# Patient Record
Sex: Male | Born: 1955 | Race: White | Hispanic: No | Smoking: Current every day smoker
Health system: Southern US, Community
[De-identification: ages and names within clinical notes are randomized; demographics above are authoritative.]

## PROBLEM LIST (undated history)

## (undated) DIAGNOSIS — D509 Iron deficiency anemia, unspecified: Principal | ICD-10-CM

## (undated) DIAGNOSIS — C189 Malignant neoplasm of colon, unspecified: Principal | ICD-10-CM

## (undated) DIAGNOSIS — R634 Abnormal weight loss: Secondary | ICD-10-CM

## (undated) DIAGNOSIS — D649 Anemia, unspecified: Secondary | ICD-10-CM

## (undated) DIAGNOSIS — R5383 Other fatigue: Secondary | ICD-10-CM

## (undated) DIAGNOSIS — M940 Chondrocostal junction syndrome [Tietze]: Secondary | ICD-10-CM

## (undated) DIAGNOSIS — K219 Gastro-esophageal reflux disease without esophagitis: Secondary | ICD-10-CM

## (undated) DIAGNOSIS — C787 Secondary malignant neoplasm of liver and intrahepatic bile duct: Principal | ICD-10-CM

## (undated) HISTORY — DX: Other fatigue: R53.83

## (undated) HISTORY — DX: Secondary malignant neoplasm of liver and intrahepatic bile duct: C78.7

## (undated) HISTORY — DX: Chondrocostal junction syndrome (tietze): M94.0

## (undated) HISTORY — DX: Malignant neoplasm of colon, unspecified: C18.9

## (undated) HISTORY — DX: Iron deficiency anemia, unspecified: D50.9

## (undated) HISTORY — PX: EXPLORATORY LAPAROTOMY: SUR591

## (undated) HISTORY — DX: Abnormal weight loss: R63.4

## (undated) HISTORY — DX: Anemia, unspecified: D64.9

## (undated) HISTORY — DX: Gastro-esophageal reflux disease without esophagitis: K21.9

---

## 2000-05-14 ENCOUNTER — Emergency Department (HOSPITAL_COMMUNITY): Admission: EM | Admit: 2000-05-14 | Discharge: 2000-05-14 | Payer: Self-pay | Admitting: Emergency Medicine

## 2000-05-14 ENCOUNTER — Encounter: Payer: Self-pay | Admitting: Emergency Medicine

## 2005-08-17 ENCOUNTER — Emergency Department (HOSPITAL_COMMUNITY): Admission: EM | Admit: 2005-08-17 | Discharge: 2005-08-17 | Payer: Self-pay | Admitting: Emergency Medicine

## 2013-03-04 ENCOUNTER — Encounter: Payer: Self-pay | Admitting: Gastroenterology

## 2013-03-12 ENCOUNTER — Encounter: Payer: Self-pay | Admitting: *Deleted

## 2013-03-14 ENCOUNTER — Other Ambulatory Visit (INDEPENDENT_AMBULATORY_CARE_PROVIDER_SITE_OTHER): Payer: 59

## 2013-03-14 ENCOUNTER — Ambulatory Visit (INDEPENDENT_AMBULATORY_CARE_PROVIDER_SITE_OTHER): Payer: 59 | Admitting: Gastroenterology

## 2013-03-14 ENCOUNTER — Encounter: Payer: Self-pay | Admitting: Gastroenterology

## 2013-03-14 VITALS — BP 100/60 | HR 100 | Ht 70.0 in | Wt 179.2 lb

## 2013-03-14 DIAGNOSIS — Z8619 Personal history of other infectious and parasitic diseases: Secondary | ICD-10-CM

## 2013-03-14 DIAGNOSIS — R5381 Other malaise: Secondary | ICD-10-CM

## 2013-03-14 DIAGNOSIS — K921 Melena: Secondary | ICD-10-CM

## 2013-03-14 DIAGNOSIS — R634 Abnormal weight loss: Secondary | ICD-10-CM

## 2013-03-14 DIAGNOSIS — D649 Anemia, unspecified: Secondary | ICD-10-CM

## 2013-03-14 DIAGNOSIS — R5383 Other fatigue: Secondary | ICD-10-CM

## 2013-03-14 DIAGNOSIS — F1010 Alcohol abuse, uncomplicated: Secondary | ICD-10-CM

## 2013-03-14 LAB — HEPATITIS B SURFACE ANTIGEN: Hepatitis B Surface Ag: NEGATIVE

## 2013-03-14 LAB — HEPATIC FUNCTION PANEL
ALT: 26 U/L (ref 0–53)
Alkaline Phosphatase: 82 U/L (ref 39–117)
Bilirubin, Direct: 0.1 mg/dL (ref 0.0–0.3)
Total Bilirubin: 0.5 mg/dL (ref 0.3–1.2)
Total Protein: 8.1 g/dL (ref 6.0–8.3)

## 2013-03-14 LAB — CBC WITH DIFFERENTIAL/PLATELET
Basophils Relative: 0.2 % (ref 0.0–3.0)
Eosinophils Absolute: 0.5 10*3/uL (ref 0.0–0.7)
Eosinophils Relative: 4.1 % (ref 0.0–5.0)
Hemoglobin: 9.3 g/dL — ABNORMAL LOW (ref 13.0–17.0)
Lymphocytes Relative: 17.8 % (ref 12.0–46.0)
Monocytes Relative: 11.9 % (ref 3.0–12.0)
Neutro Abs: 8.1 10*3/uL — ABNORMAL HIGH (ref 1.4–7.7)
Neutrophils Relative %: 66 % (ref 43.0–77.0)
RBC: 4.37 Mil/uL (ref 4.22–5.81)
WBC: 12.2 10*3/uL — ABNORMAL HIGH (ref 4.5–10.5)

## 2013-03-14 LAB — BASIC METABOLIC PANEL
Calcium: 9.1 mg/dL (ref 8.4–10.5)
Creatinine, Ser: 0.7 mg/dL (ref 0.4–1.5)
Sodium: 139 mEq/L (ref 135–145)

## 2013-03-14 LAB — PROTIME-INR
INR: 1.4 ratio — ABNORMAL HIGH (ref 0.8–1.0)
Prothrombin Time: 14.3 s — ABNORMAL HIGH (ref 10.2–12.4)

## 2013-03-14 LAB — IBC PANEL: Transferrin: 246.8 mg/dL (ref 212.0–360.0)

## 2013-03-14 LAB — VITAMIN B12: Vitamin B-12: 298 pg/mL (ref 211–911)

## 2013-03-14 LAB — HEPATITIS C ANTIBODY: HCV Ab: REACTIVE — AB
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
## 2013-03-14 NOTE — Patient Instructions (Signed)
  It has been recommended to you by your physician that you have a(n) Endoscopy/Colonoscopy completed. Per your request, we did not schedule the procedure(s) today. Please contact our office at [phone] should you decide to have the procedure completed.  Your physician has requested that you go to the basement for lab work before leaving today. _________________________________________________________________                                               We are excited to introduce MyChart, a new best-in-class service that provides you online access to important information in your electronic medical record. We want to make it easier for you to view your health information - all in one secure location - when and where you need it. We expect MyChart will enhance the quality of care and service we provide.  When you register for MyChart, you can:    View your test results.    Request appointments and receive appointment reminders via email.    Request medication renewals.    View your medical history, allergies, medications and immunizations.    Communicate with your physician's office through a password-protected site.    Conveniently print information such as your medication lists.  To find out if MyChart is right for you, please talk to a member of our clinical staff today. We will gladly answer your questions about this free health and wellness tool.  If you are age 56 or older and want a member of your family to have access to your record, you must provide written consent by completing a proxy form available at our office. Please speak to our clinical staff about guidelines regarding accounts for patients younger than age 69.  As you activate your MyChart account and need any technical assistance, please call the MyChart technical support line at [phone]-CHART [phone]) or email your question to [email or web address]. If you email your question(s), please include your  name, a return phone number and the best time to reach you.  If you have non-urgent health-related questions, you can send a message to our office through MyChart at [email or web address]. If you have a medical emergency, call 911.  Thank you for using MyChart as your new health and wellness resource!   MyChart licensed from [employer],  4782-9562. Patents Pending.

## 2013-03-17 LAB — CELIAC PANEL 10
Endomysial Screen: NEGATIVE
Gliadin IgA: 5 U/mL (ref <20)
Tissue Transglut Ab: 4.4 U/mL (ref <20)
Tissue Transglutaminase Ab, IgA: 3.9 U/mL (ref <20)

## 2013-03-19 ENCOUNTER — Telehealth: Payer: Self-pay | Admitting: *Deleted

## 2013-03-19 DIAGNOSIS — Z8619 Personal history of other infectious and parasitic diseases: Secondary | ICD-10-CM

## 2013-03-19 NOTE — Telephone Encounter (Signed)
Message copied by Florene Glen on Wed Mar 19, 2013  8:28 AM ------      Message from: Jarold Motto, DAVID R      Created: Mon Mar 17, 2013  8:17 AM       Need referral to hepatitis C clinic ------

## 2013-03-19 NOTE — Telephone Encounter (Signed)
Informed pt of the need for a referral to a Hepatitis Clinic; he decided on [place] site. Faxed info to [phone].

## 2013-03-19 NOTE — Addendum Note (Signed)
Addended by: Florene Glen on: 03/19/2013 10:30 AM   Modules accepted: Orders

## 2013-03-24 ENCOUNTER — Encounter: Payer: Self-pay | Admitting: Gastroenterology

## 2013-03-26 ENCOUNTER — Encounter: Payer: Self-pay | Admitting: Gastroenterology

## 2013-03-31 ENCOUNTER — Ambulatory Visit (AMBULATORY_SURGERY_CENTER): Payer: 59 | Admitting: *Deleted

## 2013-03-31 ENCOUNTER — Encounter: Payer: Self-pay | Admitting: Gastroenterology

## 2013-03-31 VITALS — Ht 70.0 in | Wt 174.4 lb

## 2013-03-31 DIAGNOSIS — R634 Abnormal weight loss: Secondary | ICD-10-CM

## 2013-03-31 DIAGNOSIS — K921 Melena: Secondary | ICD-10-CM

## 2013-03-31 DIAGNOSIS — D649 Anemia, unspecified: Secondary | ICD-10-CM

## 2013-03-31 MED ORDER — NA SULFATE-K SULFATE-MG SULF 17.5-3.13-1.6 GM/177ML PO SOLN: ORAL | Status: DC

## 2013-04-14 ENCOUNTER — Telehealth: Payer: Self-pay | Admitting: *Deleted

## 2013-04-14 ENCOUNTER — Ambulatory Visit (AMBULATORY_SURGERY_CENTER): Payer: 59 | Admitting: Gastroenterology

## 2013-04-14 ENCOUNTER — Other Ambulatory Visit: Payer: 59

## 2013-04-14 ENCOUNTER — Telehealth: Payer: Self-pay | Admitting: Gastroenterology

## 2013-04-14 ENCOUNTER — Encounter: Payer: Self-pay | Admitting: Gastroenterology

## 2013-04-14 VITALS — BP 99/42 | HR 84 | Temp 98.5°F | Resp 18 | Ht 70.0 in | Wt 174.0 lb

## 2013-04-14 DIAGNOSIS — C189 Malignant neoplasm of colon, unspecified: Secondary | ICD-10-CM

## 2013-04-14 DIAGNOSIS — D649 Anemia, unspecified: Secondary | ICD-10-CM

## 2013-04-14 DIAGNOSIS — K2270 Barrett's esophagus without dysplasia: Secondary | ICD-10-CM

## 2013-04-14 DIAGNOSIS — D126 Benign neoplasm of colon, unspecified: Secondary | ICD-10-CM

## 2013-04-14 DIAGNOSIS — K219 Gastro-esophageal reflux disease without esophagitis: Secondary | ICD-10-CM

## 2013-04-14 DIAGNOSIS — K209 Esophagitis, unspecified without bleeding: Secondary | ICD-10-CM

## 2013-04-14 MED ORDER — ESOMEPRAZOLE MAGNESIUM 40 MG PO CPDR: 40.0000 mg | DELAYED_RELEASE_CAPSULE | Freq: Every day | ORAL | Status: DC

## 2013-04-14 MED ORDER — SODIUM CHLORIDE 0.9 % IV SOLN: 500.0000 mL | INTRAVENOUS | Status: DC

## 2013-04-14 NOTE — Telephone Encounter (Signed)
Message copied by Florene Glen on Mon Apr 14, 2013  4:59 PM ------      Message from: Ivory Broad      Created: Mon Apr 14, 2013  4:14 PM       Hi Aram Beecham.  I see that Dr Maisie Fus is seeing this pt for colon cancer.  He is having a ct abd/ pelvis tomorrow and typically she wants a chest too for cancer patients.  Will you see if Dr Jarold Motto will let you add that on?                  Thanks,            Ivory Broad RN at [facility] ------

## 2013-04-14 NOTE — Telephone Encounter (Signed)
Dr Jarold Motto, can we add Chest to pt's CT for Dr Romie Levee? Thanks, CT is a 0830am Tuesday.

## 2013-04-14 NOTE — Telephone Encounter (Signed)
Patient had procedure done this morning Prescription was sent by French Ana, RN in recovery  I called [facility] were RX was sent, spoke with Marchelle Folks and per Marchelle Folks prior auth needed to be done for Nexium.  I was given [phone] to call for prior

## 2013-04-14 NOTE — Patient Instructions (Addendum)
Your colonoscopy and endoscopy reports are sealed in an envelope.  GERD (handout given)  Labwork to be completed when you leave from the Endoscopy center today.  A CT scan has been scheduled for you. As well as an appointment with the surgeon. Appointments are listed above.  YOU HAD AN ENDOSCOPIC PROCEDURE TODAY AT THE [facility]: Refer to the procedure report that was given to you for any specific questions about what was found during the examination.  If the procedure report does not answer your questions, please call your gastroenterologist to clarify.  If you requested that your care partner not be given the details of your procedure findings, then the procedure report has been included in a sealed envelope for you to review at your convenience later.  YOU SHOULD EXPECT: Some feelings of bloating in the abdomen. Passage of more gas than usual.  Walking can help get rid of the air that was put into your GI tract during the procedure and reduce the bloating. If you had a lower endoscopy (such as a colonoscopy or flexible sigmoidoscopy) you may notice spotting of blood in your stool or on the toilet paper. If you underwent a bowel prep for your procedure, then you may not have a normal bowel movement for a few days.  DIET: Your first meal following the procedure should be a light meal and then it is ok to progress to your normal diet.  A half-sandwich or bowl of soup is an example of a good first meal.  Heavy or fried foods are harder to digest and may make you feel nauseous or bloated.  Likewise meals heavy in dairy and vegetables can cause extra gas to form and this can also increase the bloating.  Drink plenty of fluids but you should avoid alcoholic beverages for 24 hours.  ACTIVITY: Your care partner should take you home directly after the procedure.  You should plan to take it easy, moving slowly for the rest of the day.  You can resume normal activity the day after the procedure  however you should NOT DRIVE or use heavy machinery for 24 hours (because of the sedation medicines used during the test).    SYMPTOMS TO REPORT IMMEDIATELY: A gastroenterologist can be reached at any hour.  During normal business hours, 8:30 AM to 5:00 PM Monday through Friday, call [phone].  After hours and on weekends, please call the GI answering service at [phone] who will take a message and have the physician on call contact you.   Following lower endoscopy (colonoscopy or flexible sigmoidoscopy):  Excessive amounts of blood in the stool  Significant tenderness or worsening of abdominal pains  Swelling of the abdomen that is new, acute  Fever of 100F or higher  Following upper endoscopy (EGD)  Vomiting of blood or coffee ground material  New chest pain or pain under the shoulder blades  Painful or persistently difficult swallowing  New shortness of breath  Fever of 100F or higher  Black, tarry-looking stools  FOLLOW UP: If any biopsies were taken you will be contacted by phone or by letter within the next 1-3 weeks.  Call your gastroenterologist if you have not heard about the biopsies in 3 weeks.  Our staff will call the home number listed on your records the next business day following your procedure to check on you and address any questions or concerns that you may have at that time regarding the information given to you following your procedure.  This is a courtesy call and so if there is no answer at the home number and we have not heard from you through the emergency physician on call, we will assume that you have returned to your regular daily activities without incident.  SIGNATURES/CONFIDENTIALITY: You and/or your care partner have signed paperwork which will be entered into your electronic medical record.  These signatures attest to the fact that that the information above on your After Visit Summary has been reviewed and is understood.  Full responsibility of  the confidentiality of this discharge information lies with you and/or your care-partner.

## 2013-04-14 NOTE — Progress Notes (Signed)
Called to room to assist during endoscopic procedure.  Patient ID and intended procedure confirmed with present staff. Received instructions for my participation in the procedure from the performing physician.  

## 2013-04-14 NOTE — Telephone Encounter (Signed)
Pt given instructions for tomorrow's CT and a sheet with his appt with Dr Maisie Fus. He will get a CEA today and Nexium has been ordered. Encouraged pt to call me tomorrow for questions. Pt stated understanding.

## 2013-04-14 NOTE — Progress Notes (Signed)
Patient did not have preoperative order for IV antibiotic SSI prophylaxis. (G8918)  Patient did not experience any of the following events: a burn prior to discharge; a fall within the facility; wrong site/side/patient/procedure/implant event; or a hospital transfer or hospital admission upon discharge from the facility. (G8907)  

## 2013-04-14 NOTE — Op Note (Signed)
[facility] [address] [place], [address]   COLONOSCOPY PROCEDURE REPORT  PATIENT: Tony Barajas, Tony Barajas  MR#: [id number] BIRTHDATE: 06/11/1956 , 57  yrs. old GENDER: Male ENDOSCOPIST: Mardella Layman, MD, [facility] REFERRED BY:  Gabriel Earing, M.D. PROCEDURE DATE:  04/14/2013 PROCEDURE:   Colonoscopy with biopsy, Colonoscopy with snare polypectomy, and Submucosal injection, any substance ASA CLASS:   Class II INDICATIONS:Iron Deficiency Anemia. MEDICATIONS: propofol (Diprivan) 400mg  IV  DESCRIPTION OF PROCEDURE:   After the risks and benefits and of the procedure were explained, informed consent was obtained.  A digital rectal exam revealed no abnormalities of the rectum.    The LB [id number]  endoscope was introduced through the anus and advanced to the hepatic flexure .  The quality of the prep was excellent, using MoviPrep .  The instrument was then slowly withdrawn as the colon was fully examined.     COLON FINDINGS: The colonoscope was advanced without difficulty to the level of the hepatic flexure.  At this point there was a mass obstructing the colon.  We partially  passed this obstructing lesion in the cecum, but could not reach the tip of the cecum.  The mass was friable, constricting, and bled to touch..  Multiple pictures were obtained as were multiple mucosal biopsies.  The lesion was then tattooed with sterile [place] INK.  There was a proximal polyp in the transverse colon measure approximately 1 1/2 cm.  This was removed with electrocautery snare without difficulty and placed in a separate jar.  Withdrawal of colonoscope showed some scattered diverticula but otherwise was unremarkable including retroflexed view of the rectum.  The patient tolerated this procedure well.          Retroflexed views revealed no abnormalities.     The scope was then withdrawn from the patient and the procedure completed. COMPLICATIONS: There were no  complications. ENDOSCOPIC IMPRESSION:Obstructing colon cancer at the hepatic flexure the colon.  A small proximal transverse colon polyp was removed without difficulty, and there scattered left colon diverticula.  This polyp will need surgical resection.  Biopsies were obtained and sent rush to pathology for exam.  Area was tattooed with [place] ink for better visualization at the time of surgery.  RECOMMENDATIONS: We will set this patient up for surgical consultation and CT scan of the abdomen:, Labs and serum CEA level.  Endoscopy is also been scheduled to exclude any other contributing lesions to his anemia.  REPEAT EXAM:  cc:  _______________________________ eSignedMardella Layman, MD, [facility] 04/14/2013 10:19 AM

## 2013-04-14 NOTE — Progress Notes (Signed)
PT. Decided after doing health history that he does not want friend to know results, so HIPPA SIGN placed on bed.

## 2013-04-14 NOTE — Telephone Encounter (Signed)
I spoke with Corrie Dandy at [phone] RX) to start prior auth Per Corrie Dandy a form will be faxed over for me to fill out and fax back to start prior auth Received form, filled out and faxed back.    I called patient and I advised that I have to start a prior authorization through his insurance company for the Nexium. I advised patient that he can purchase Nexium over the counter its 20 mg and take two once a day to equal 40 mg or I can leave him some samples up front for him to pick up. I advised patient that we do not get a lot of samples of Nexium now because it has gone over the counter, so will leave up front what we have. I advised patient that once I hear back from the insurance company I will call him back. Patient verbalized understanding and will be here tomorrow after his CT scan to pick up the Nexium samples

## 2013-04-14 NOTE — Op Note (Signed)
[facility] [address] [place], [address]   ENDOSCOPY PROCEDURE REPORT  PATIENT: Tony Barajas, Tony Barajas  MR#: [id number] BIRTHDATE: Jun 13, 1956 , 57  yrs. old GENDER: Male ENDOSCOPIST:Deangelo Berns Hale Bogus, MD, [facility] REFERRED BY: Gabriel Earing, M.D. PROCEDURE DATE:  04/14/2013 PROCEDURE:   EGD w/ biopsy ASA CLASS:    Class II INDICATIONS: Iron deficiency anemia. and colon cancer newly Dx, MEDICATION: There was residual sedation effect present from prior procedure and propofol (Diprivan) 150mg  IV TOPICAL ANESTHETIC:   Cetacaine Spray  DESCRIPTION OF PROCEDURE:   After the risks and benefits of the procedure were explained, informed consent was obtained.  The LB [id number]  endoscope was introduced through the mouth  and advanced to the second portion of the duodenum .  The instrument was slowly withdrawn as the mucosa was fully examined.      DUODENUM: The duodenal mucosa showed no abnormalities in the bulb and second portion of the duodenum.  STOMACH: The mucosa of the stomach appeared normal.  ESOPHAGUS: There was evidence of suspected Barrett's esophagus in the lower third of the esophagus. mucosal biopsies were obtained. Please see pictures for documentation.  There is no large hiatal hernia, and the rest of the esophagus appeared normal. Retroflexed views revealed no abnormalities.    The scope was then withdrawn from the patient and the procedure completed.  COMPLICATIONS: There were no complications.   ENDOSCOPIC IMPRESSION: 1.   The duodenal mucosa showed no abnormalities in the bulb and second portion of the duodenum 2.   The mucosa of the stomach appeared normal 3.   There was evidence of suspected Barrett's esophagus ,c/w chronic GERD  RECOMMENDATIONS: 1.  Await pathology results 2.  Continue PPI    _______________________________ eSigned:  Mardella Layman, MD, [facility] 04/14/2013 10:25 AM

## 2013-04-15 ENCOUNTER — Ambulatory Visit (INDEPENDENT_AMBULATORY_CARE_PROVIDER_SITE_OTHER)
Admission: RE | Admit: 2013-04-15 | Discharge: 2013-04-15 | Disposition: A | Discharge status: Home / Self Care | Payer: 59 | Source: Ambulatory Visit | Attending: Gastroenterology | Admitting: Gastroenterology

## 2013-04-15 ENCOUNTER — Telehealth: Payer: Self-pay | Admitting: *Deleted

## 2013-04-15 ENCOUNTER — Telehealth (INDEPENDENT_AMBULATORY_CARE_PROVIDER_SITE_OTHER): Payer: Self-pay

## 2013-04-15 DIAGNOSIS — C189 Malignant neoplasm of colon, unspecified: Secondary | ICD-10-CM

## 2013-04-15 MED ORDER — IOHEXOL 300 MG/ML  SOLN
100.0000 mL | Freq: Once | INTRAMUSCULAR | Status: AC | PRN
  Administered 2013-04-15: 100 mL via INTRAVENOUS

## 2013-04-15 NOTE — Telephone Encounter (Signed)
As per our conversation, oncology referral is appropriate.  His prognosis does not look good.  Also be sure we copy his primary care physician

## 2013-04-15 NOTE — Telephone Encounter (Signed)
Dr Romie Levee has reviewed the CT scan and she has determined the mass is non resectable and pt should be referred to Oncology. Called Tony Barajas for an emergent referral. Spoke with pt to inform him he does not need to see the surgeon unless we tell him different. Informed him he has been referred to the Cancer Center for treatment other than surgery. Either me or Tony Barajas will call him back. Also informed him of S&S of an obstruction: severe abdominal pain, nausea and or vomiting, unable to pass gas or have a BM; if he has any of these he should call 911. Pt stated understanding.

## 2013-04-15 NOTE — Telephone Encounter (Signed)
Received FMLA papers per fax and have forwarded the paperwork to [employer]. Faxed ofc notes/procedures to Dr Earlene Plater Cloward at [phone] ph    [phone] fax

## 2013-04-15 NOTE — Telephone Encounter (Signed)
  Follow up Call-  Call back number 04/14/2013  Post procedure Call Back phone  # [phone]  Permission to leave phone message Yes     Patient questions:  Do you have a fever, pain , or abdominal swelling? no Pain Score  0 *  Have you tolerated food without any problems? yes  Have you been able to return to your normal activities? yes  Do you have any questions about your discharge instructions: Diet   no Medications  no Follow up visit  no  Do you have questions or concerns about your Care? no  Actions: * If pain score is 4 or above: No action needed, pain <4.  Pt. Stated he was on his way to the do his CT SCAN. He did okay yesterday.

## 2013-04-15 NOTE — Telephone Encounter (Signed)
Aram Beecham requested Dr Maisie Fus look at the CT on this pt because Dr Jarold Motto is out of the office.  He is scheduled to see her 7/28.  Dr Maisie Fus reviewed the scan and states he is not a surgical candidate.  He needs to see Oncology urgently.  She doesn't really need to see him because the mass is non-resectable.  He doesn't need to go to the ER unless he has obstructive symptoms or massive GI bleed.  I notified Aram Beecham and she will speak to Dr Jarold Motto.

## 2013-04-16 ENCOUNTER — Telehealth: Payer: Self-pay | Admitting: *Deleted

## 2013-04-16 MED ORDER — OMEPRAZOLE 40 MG PO CPDR: 40.0000 mg | DELAYED_RELEASE_CAPSULE | Freq: Every day | ORAL | Status: DC

## 2013-04-16 NOTE — Telephone Encounter (Signed)
Spoke with patient by phone and confirmed appointment with Dr. Truett Perna for 04/21/13.  Contact name and phone numbers were provided.

## 2013-04-16 NOTE — Telephone Encounter (Signed)
Patient notified Nexium was denied Patient will try Omeprazole 40 mg for a month RX sent

## 2013-04-21 ENCOUNTER — Encounter: Payer: Self-pay | Admitting: Oncology

## 2013-04-21 ENCOUNTER — Ambulatory Visit (HOSPITAL_BASED_OUTPATIENT_CLINIC_OR_DEPARTMENT_OTHER): Payer: 59

## 2013-04-21 ENCOUNTER — Ambulatory Visit (HOSPITAL_BASED_OUTPATIENT_CLINIC_OR_DEPARTMENT_OTHER): Payer: 59 | Admitting: Oncology

## 2013-04-21 VITALS — BP 115/65 | HR 104 | Temp 97.8°F | Resp 18 | Ht 70.0 in | Wt 170.1 lb

## 2013-04-21 DIAGNOSIS — C787 Secondary malignant neoplasm of liver and intrahepatic bile duct: Secondary | ICD-10-CM

## 2013-04-21 DIAGNOSIS — C189 Malignant neoplasm of colon, unspecified: Secondary | ICD-10-CM

## 2013-04-21 NOTE — Progress Notes (Signed)
Checked in new patient. No financial issues. Didn't ask about poa/living will.
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
## 2013-04-22 ENCOUNTER — Encounter: Payer: 59 | Admitting: Gastroenterology

## 2013-04-22 ENCOUNTER — Telehealth (HOSPITAL_COMMUNITY): Payer: Self-pay | Admitting: Radiology

## 2013-04-22 NOTE — Telephone Encounter (Signed)
Called patient to give portacath appointment for 04-28-13 arrive at 9:30, npo after midnight, have driver available.  Patient stated that Dr. Truett Perna had told him he was to have a PICC line, but when describing the procedure patient described a portacath.  I explained the difference between the two lines, and that his physician had ordered a portacath.  Patient was also concerned about why the port would be ordered prior to the biopsy results being available, and if insurance would cover the procedures done in this order.  I informed patient that I would call Dr. Kalman Drape RN and make her aware of his concerns.  Patient had no further questions.  Voicemail was left for Dr. Kalman Drape RN.

## 2013-04-22 NOTE — Addendum Note (Signed)
Addended by: Gala Romney on: 04/22/2013 10:12 AM   Modules accepted: Orders

## 2013-04-23 ENCOUNTER — Telehealth: Payer: Self-pay | Admitting: Oncology

## 2013-04-23 ENCOUNTER — Encounter (HOSPITAL_COMMUNITY): Payer: Self-pay | Admitting: Pharmacy Technician

## 2013-04-23 NOTE — Telephone Encounter (Signed)
Pt aware of appt for 8/4

## 2013-04-23 NOTE — Telephone Encounter (Signed)
S/w the pt and he is aware of his august appt with dr Truett Perna.

## 2013-04-24 ENCOUNTER — Encounter: Payer: Self-pay | Admitting: Gastroenterology

## 2013-04-25 ENCOUNTER — Other Ambulatory Visit: Payer: Self-pay | Admitting: Radiology

## 2013-04-25 MED ORDER — DEXTROSE 5 % IV SOLN: 2.0000 g | Freq: Once | INTRAVENOUS | Status: DC

## 2013-04-25 MED ORDER — SODIUM CHLORIDE 0.9 % IV SOLN: Freq: Once | INTRAVENOUS | Status: DC

## 2013-04-28 ENCOUNTER — Ambulatory Visit (HOSPITAL_COMMUNITY)
Admission: RE | Admit: 2013-04-28 | Discharge: 2013-04-28 | Disposition: A | Discharge status: Home / Self Care | Payer: 59 | Source: Ambulatory Visit | Attending: Oncology | Admitting: Oncology

## 2013-04-28 ENCOUNTER — Ambulatory Visit (INDEPENDENT_AMBULATORY_CARE_PROVIDER_SITE_OTHER): Payer: Self-pay | Admitting: General Surgery

## 2013-04-28 ENCOUNTER — Encounter (HOSPITAL_COMMUNITY): Payer: Self-pay

## 2013-04-28 ENCOUNTER — Other Ambulatory Visit: Payer: Self-pay | Admitting: Oncology

## 2013-04-28 DIAGNOSIS — D72829 Elevated white blood cell count, unspecified: Secondary | ICD-10-CM | POA: Insufficient documentation

## 2013-04-28 DIAGNOSIS — C189 Malignant neoplasm of colon, unspecified: Secondary | ICD-10-CM

## 2013-04-28 DIAGNOSIS — K219 Gastro-esophageal reflux disease without esophagitis: Secondary | ICD-10-CM | POA: Insufficient documentation

## 2013-04-28 DIAGNOSIS — K7689 Other specified diseases of liver: Secondary | ICD-10-CM | POA: Insufficient documentation

## 2013-04-28 LAB — CBC
HCT: 25.4 % — ABNORMAL LOW (ref 39.0–52.0)
MCHC: 29.1 g/dL — ABNORMAL LOW (ref 30.0–36.0)
MCV: 69 fL — ABNORMAL LOW (ref 78.0–100.0)
RDW: 18.2 % — ABNORMAL HIGH (ref 11.5–15.5)

## 2013-04-28 MED ORDER — LIDOCAINE HCL 1 % IJ SOLN
INTRAMUSCULAR | Status: AC
  Filled 2013-04-28: qty 20

## 2013-04-28 MED ORDER — MIDAZOLAM HCL 2 MG/2ML IJ SOLN
INTRAMUSCULAR | Status: AC
  Filled 2013-04-28: qty 4

## 2013-04-28 MED ORDER — FENTANYL CITRATE 0.05 MG/ML IJ SOLN
INTRAMUSCULAR | Status: AC
  Filled 2013-04-28: qty 4

## 2013-04-28 MED ORDER — CEFAZOLIN SODIUM-DEXTROSE 2-3 GM-% IV SOLR
2.0000 g | Freq: Once | INTRAVENOUS | Status: AC
  Administered 2013-04-28: 2 g via INTRAVENOUS
  Filled 2013-04-28: qty 50

## 2013-04-28 MED ORDER — FENTANYL CITRATE 0.05 MG/ML IJ SOLN
INTRAMUSCULAR | Status: AC | PRN
  Administered 2013-04-28 (×2): 100 ug via INTRAVENOUS

## 2013-04-28 MED ORDER — SODIUM CHLORIDE 0.9 % IV SOLN
Freq: Once | INTRAVENOUS | Status: AC
  Administered 2013-04-28: 20 mL/h via INTRAVENOUS

## 2013-04-28 MED ORDER — HEPARIN SOD (PORK) LOCK FLUSH 100 UNIT/ML IV SOLN
INTRAVENOUS | Status: AC | PRN
  Administered 2013-04-28: 500 [IU] via INTRAVENOUS

## 2013-04-28 MED ORDER — MIDAZOLAM HCL 2 MG/2ML IJ SOLN
INTRAMUSCULAR | Status: AC | PRN
  Administered 2013-04-28 (×2): 2 mg via INTRAVENOUS

## 2013-04-28 NOTE — Procedures (Signed)
Successful RT IJ POWER PAC TIP SVC/RA NO COMP STABLE READY FOR USE  
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
## 2013-04-30 ENCOUNTER — Other Ambulatory Visit: Payer: Self-pay | Admitting: *Deleted

## 2013-04-30 ENCOUNTER — Encounter: Payer: Self-pay | Admitting: Nurse Practitioner

## 2013-04-30 ENCOUNTER — Ambulatory Visit (HOSPITAL_COMMUNITY)
Admission: RE | Admit: 2013-04-30 | Discharge: 2013-04-30 | Disposition: A | Discharge status: Home / Self Care | Payer: 59 | Source: Ambulatory Visit | Attending: Oncology | Admitting: Oncology

## 2013-04-30 ENCOUNTER — Ambulatory Visit: Payer: 59 | Admitting: Lab

## 2013-04-30 ENCOUNTER — Other Ambulatory Visit: Payer: Self-pay | Admitting: Nurse Practitioner

## 2013-04-30 ENCOUNTER — Encounter (HOSPITAL_COMMUNITY)
Admission: RE | Admit: 2013-04-30 | Discharge: 2013-04-30 | Disposition: A | Discharge status: Home / Self Care | Payer: 59 | Source: Ambulatory Visit | Attending: Oncology | Admitting: Oncology

## 2013-04-30 ENCOUNTER — Ambulatory Visit (HOSPITAL_BASED_OUTPATIENT_CLINIC_OR_DEPARTMENT_OTHER): Payer: 59

## 2013-04-30 VITALS — BP 102/58 | HR 86 | Temp 98.1°F | Resp 16

## 2013-04-30 DIAGNOSIS — D649 Anemia, unspecified: Secondary | ICD-10-CM | POA: Insufficient documentation

## 2013-04-30 DIAGNOSIS — C189 Malignant neoplasm of colon, unspecified: Secondary | ICD-10-CM

## 2013-04-30 DIAGNOSIS — K639 Disease of intestine, unspecified: Secondary | ICD-10-CM | POA: Insufficient documentation

## 2013-04-30 DIAGNOSIS — D509 Iron deficiency anemia, unspecified: Secondary | ICD-10-CM | POA: Insufficient documentation

## 2013-04-30 DIAGNOSIS — K7689 Other specified diseases of liver: Secondary | ICD-10-CM | POA: Insufficient documentation

## 2013-04-30 HISTORY — DX: Iron deficiency anemia, unspecified: D50.9

## 2013-04-30 LAB — CBC WITH DIFFERENTIAL/PLATELET
Basophils Relative: 0 % (ref 0–1)
Eosinophils Absolute: 0.3 10*3/uL (ref 0.0–0.7)
HCT: 22.3 % — ABNORMAL LOW (ref 39.0–52.0)
Hemoglobin: 6.5 g/dL — CL (ref 13.0–17.0)
MCH: 20.1 pg — ABNORMAL LOW (ref 26.0–34.0)
MCHC: 29.1 g/dL — ABNORMAL LOW (ref 30.0–36.0)
MCV: 68.8 fL — ABNORMAL LOW (ref 78.0–100.0)
Monocytes Absolute: 1.4 10*3/uL — ABNORMAL HIGH (ref 0.1–1.0)
Neutro Abs: 8.7 10*3/uL — ABNORMAL HIGH (ref 1.7–7.7)

## 2013-04-30 LAB — ABO/RH: ABO/RH(D): B POS

## 2013-04-30 MED ORDER — SODIUM CHLORIDE 0.9 % IJ SOLN
10.0000 mL | INTRAMUSCULAR | Status: AC | PRN
  Administered 2013-04-30: 10 mL
  Filled 2013-04-30: qty 10

## 2013-04-30 MED ORDER — FERUMOXYTOL INJECTION 510 MG/17 ML
1020.0000 mg | Freq: Once | INTRAVENOUS | Status: AC
  Administered 2013-04-30: 1020 mg via INTRAVENOUS
  Filled 2013-04-30: qty 34

## 2013-04-30 MED ORDER — SODIUM CHLORIDE 0.9 % IV SOLN
INTRAVENOUS | Status: DC
  Administered 2013-04-30: 20 mL/h via INTRAVENOUS

## 2013-04-30 MED ORDER — HEPARIN SOD (PORK) LOCK FLUSH 100 UNIT/ML IV SOLN
500.0000 [IU] | Freq: Every day | INTRAVENOUS | Status: AC | PRN
  Administered 2013-04-30: 500 [IU]
  Filled 2013-04-30: qty 5

## 2013-04-30 MED ORDER — SODIUM CHLORIDE 0.9 % IV SOLN
250.0000 mL | Freq: Once | INTRAVENOUS | Status: AC
  Administered 2013-04-30: 250 mL via INTRAVENOUS

## 2013-04-30 NOTE — Progress Notes (Signed)
Brayton El PA notified of HGB. 6.5

## 2013-04-30 NOTE — Progress Notes (Signed)
Pt to IR for liver lesion biopsy. Hgb has decreased to 6.5 from 7.4 jst 2 days ago. Known hepatic flexure colon mass, but denies frank blood in stool. Stools dark, but also on iron.  BP 102/73 and HR 107bpm  D/W Dr. Grace Isaac who feels Hgb is too low to do liver biopsy due to risk of bleeding/hemorrhage. Have spoke with Lonna Cobb, NP at Dr. Kalman Drape office. Will hold off on biopsy today. Pt to go to Cancer center for T&C and transfusion. Can reschedule biopsy for later this week if stable. Pt understands and agrees with plan.  Brayton El PA-C Interventional Radiology 04/30/2013 12:50 PM

## 2013-04-30 NOTE — Patient Instructions (Addendum)
Ferumoxytol injection What is this medicine? FERUMOXYTOL is an iron complex. Iron is used to make healthy red blood cells, which carry oxygen and nutrients throughout the body. This medicine is used to treat iron deficiency anemia in people with chronic kidney disease. This medicine may be used for other purposes; ask your health care provider or pharmacist if you have questions. What should I tell my health care provider before I take this medicine? They need to know if you have any of these conditions: -anemia not caused by low iron levels -high levels of iron in the blood -magnetic resonance imaging (MRI) test scheduled -an unusual or allergic reaction to iron, other medicines, foods, dyes, or preservatives -pregnant or trying to get pregnant -breast-feeding How should I use this medicine? This medicine is for infusion into a vein. It is given by a health care professional in a hospital or clinic setting. Talk to your pediatrician regarding the use of this medicine in children. Special care may be needed. Overdosage: If you think you've taken too much of this medicine contact a poison control center or emergency room at once. Overdosage: If you think you have taken too much of this medicine contact a poison control center or emergency room at once. NOTE: This medicine is only for you. Do not share this medicine with others. What if I miss a dose? It is important not to miss your dose. Call your doctor or health care professional if you are unable to keep an appointment. What may interact with this medicine? This medicine may interact with the following medications: -other iron products This list may not describe all possible interactions. Give your health care provider a list of all the medicines, herbs, non-prescription drugs, or dietary supplements you use. Also tell them if you smoke, drink alcohol, or use illegal drugs. Some items may interact with your medicine. What should I watch  for while using this medicine? Visit your doctor or healthcare professional regularly. Tell your doctor or healthcare professional if your symptoms do not start to get better or if they get worse. You may need blood work done while you are taking this medicine. You may need to follow a special diet. Talk to your doctor. Foods that contain iron include: whole grains/cereals, dried fruits, beans, or peas, leafy green vegetables, and organ meats (liver, kidney). What side effects may I notice from receiving this medicine? Side effects that you should report to your doctor or health care professional as soon as possible: -allergic reactions like skin rash, itching or hives, swelling of the face, lips, or tongue -breathing problems -changes in blood pressure -feeling faint or lightheaded, falls -fever or chills -flushing, sweating, or hot feelings -swelling of the ankles or feet Side effects that usually do not require medical attention (Report these to your doctor or health care professional if they continue or are bothersome.): -diarrhea -headache -nausea, vomiting -stomach pain This list may not describe all possible side effects. Call your doctor for medical advice about side effects. You may report side effects to FDA at 1-800-FDA-1088. Where should I keep my medicine? This drug is given in a hospital or clinic and will not be stored at home. NOTE: This sheet is a summary. It may not cover all possible information. If you have questions about this medicine, talk to your doctor, pharmacist, or health care provider.  2012, Elsevier/Gold Standard. (06/10/2008 9:48:25 PM)Blood Transfusion Information WHAT IS A BLOOD TRANSFUSION? A transfusion is the replacement of blood or some of  its parts. Blood is made up of multiple cells which provide different functions.  Red blood cells carry oxygen and are used for blood loss replacement.  White blood cells fight against infection.  Platelets control  bleeding.  Plasma helps clot blood.  Other blood products are available for specialized needs, such as hemophilia or other clotting disorders. BEFORE THE TRANSFUSION  Who gives blood for transfusions?   You may be able to donate blood to be used at a later date on yourself (autologous donation).  Relatives can be asked to donate blood. This is generally not any safer than if you have received blood from a stranger. The same precautions are taken to ensure safety when a relative's blood is donated.  Healthy volunteers who are fully evaluated to make sure their blood is safe. This is blood bank blood. Transfusion therapy is the safest it has ever been in the practice of medicine. Before blood is taken from a donor, a complete history is taken to make sure that person has no history of diseases nor engages in risky social behavior (examples are intravenous drug use or sexual activity with multiple partners). The donor's travel history is screened to minimize risk of transmitting infections, such as malaria. The donated blood is tested for signs of infectious diseases, such as HIV and hepatitis. The blood is then tested to be sure it is compatible with you in order to minimize the chance of a transfusion reaction. If you or a relative donates blood, this is often done in anticipation of surgery and is not appropriate for emergency situations. It takes many days to process the donated blood. RISKS AND COMPLICATIONS Although transfusion therapy is very safe and saves many lives, the main dangers of transfusion include:   Getting an infectious disease.  Developing a transfusion reaction. This is an allergic reaction to something in the blood you were given. Every precaution is taken to prevent this. The decision to have a blood transfusion has been considered carefully by your caregiver before blood is given. Blood is not given unless the benefits outweigh the risks. AFTER THE TRANSFUSION  Right  after receiving a blood transfusion, you will usually feel much better and more energetic. This is especially true if your red blood cells have gotten low (anemic). The transfusion raises the level of the red blood cells which carry oxygen, and this usually causes an energy increase.  The nurse administering the transfusion will monitor you carefully for complications. HOME CARE INSTRUCTIONS  No special instructions are needed after a transfusion. You may find your energy is better. Speak with your caregiver about any limitations on activity for underlying diseases you may have. SEEK MEDICAL CARE IF:   Your condition is not improving after your transfusion.  You develop redness or irritation at the intravenous (IV) site. SEEK IMMEDIATE MEDICAL CARE IF:  Any of the following symptoms occur over the next 12 hours:  Shaking chills.  You have a temperature by mouth above 102 F (38.9 C), not controlled by medicine.  Chest, back, or muscle pain.  People around you feel you are not acting correctly or are confused.  Shortness of breath or difficulty breathing.  Dizziness and fainting.  You get a rash or develop hives.  You have a decrease in urine output.  Your urine turns a dark color or changes to pink, red, or brown. Any of the following symptoms occur over the next 10 days:  You have a temperature by mouth above 102 F (  38.9 C), not controlled by medicine.  Shortness of breath.  Weakness after normal activity.  The white part of the eye turns yellow (jaundice).  You have a decrease in the amount of urine or are urinating less often.  Your urine turns a dark color or changes to pink, red, or brown. Document Released: 09/15/2000 Document Revised: 12/11/2011 Document Reviewed: 05/04/2008 [facility] Patient Information 2014 [place], [place].   Blood Transfusion Information WHAT IS A BLOOD TRANSFUSION? A transfusion is the replacement of blood or some of its parts. Blood is  made up of multiple cells which provide different functions.  Red blood cells carry oxygen and are used for blood loss replacement.  White blood cells fight against infection.  Platelets control bleeding.  Plasma helps clot blood.  Other blood products are available for specialized needs, such as hemophilia or other clotting disorders. BEFORE THE TRANSFUSION  Who gives blood for transfusions?   You may be able to donate blood to be used at a later date on yourself (autologous donation).  Relatives can be asked to donate blood. This is generally not any safer than if you have received blood from a stranger. The same precautions are taken to ensure safety when a relative's blood is donated.  Healthy volunteers who are fully evaluated to make sure their blood is safe. This is blood bank blood. Transfusion therapy is the safest it has ever been in the practice of medicine. Before blood is taken from a donor, a complete history is taken to make sure that person has no history of diseases nor engages in risky social behavior (examples are intravenous drug use or sexual activity with multiple partners). The donor's travel history is screened to minimize risk of transmitting infections, such as malaria. The donated blood is tested for signs of infectious diseases, such as HIV and hepatitis. The blood is then tested to be sure it is compatible with you in order to minimize the chance of a transfusion reaction. If you or a relative donates blood, this is often done in anticipation of surgery and is not appropriate for emergency situations. It takes many days to process the donated blood. RISKS AND COMPLICATIONS Although transfusion therapy is very safe and saves many lives, the main dangers of transfusion include:   Getting an infectious disease.  Developing a transfusion reaction. This is an allergic reaction to something in the blood you were given. Every precaution is taken to prevent this. The  decision to have a blood transfusion has been considered carefully by your caregiver before blood is given. Blood is not given unless the benefits outweigh the risks. AFTER THE TRANSFUSION  Right after receiving a blood transfusion, you will usually feel much better and more energetic. This is especially true if your red blood cells have gotten low (anemic). The transfusion raises the level of the red blood cells which carry oxygen, and this usually causes an energy increase.  The nurse administering the transfusion will monitor you carefully for complications. HOME CARE INSTRUCTIONS  No special instructions are needed after a transfusion. You may find your energy is better. Speak with your caregiver about any limitations on activity for underlying diseases you may have. SEEK MEDICAL CARE IF:   Your condition is not improving after your transfusion.  You develop redness or irritation at the intravenous (IV) site. SEEK IMMEDIATE MEDICAL CARE IF:  Any of the following symptoms occur over the next 12 hours:  Shaking chills.  You have a temperature by mouth above  102 F (38.9 C), not controlled by medicine.  Chest, back, or muscle pain.  People around you feel you are not acting correctly or are confused.  Shortness of breath or difficulty breathing.  Dizziness and fainting.  You get a rash or develop hives.  You have a decrease in urine output.  Your urine turns a dark color or changes to pink, red, or brown. Any of the following symptoms occur over the next 10 days:  You have a temperature by mouth above 102 F (38.9 C), not controlled by medicine.  Shortness of breath.  Weakness after normal activity.  The white part of the eye turns yellow (jaundice).  You have a decrease in the amount of urine or are urinating less often.  Your urine turns a dark color or changes to pink, red, or brown. Document Released: 09/15/2000 Document Revised: 12/11/2011 Document Reviewed:  05/04/2008 [facility] Patient Information 2014 [place], [place].

## 2013-04-30 NOTE — Progress Notes (Signed)
Port-a-cath. Flushed with 10ml of Saline per protocal, pt. Up to change clothes. Pt. Placed in W/C and taken to cancer center, pt. To receive blood in the cancer center today. Procedure to be rescheduled.

## 2013-05-01 ENCOUNTER — Other Ambulatory Visit: Payer: Self-pay | Admitting: Radiology

## 2013-05-01 ENCOUNTER — Telehealth: Payer: Self-pay | Admitting: *Deleted

## 2013-05-01 ENCOUNTER — Other Ambulatory Visit: Payer: Self-pay | Admitting: *Deleted

## 2013-05-01 ENCOUNTER — Ambulatory Visit (HOSPITAL_BASED_OUTPATIENT_CLINIC_OR_DEPARTMENT_OTHER): Payer: 59

## 2013-05-01 VITALS — BP 102/61 | HR 88 | Temp 97.8°F | Resp 18

## 2013-05-01 DIAGNOSIS — D649 Anemia, unspecified: Secondary | ICD-10-CM

## 2013-05-01 MED ORDER — SODIUM CHLORIDE 0.9 % IV SOLN: 250.0000 mL | Freq: Once | INTRAVENOUS | Status: DC

## 2013-05-01 MED ORDER — SODIUM CHLORIDE 0.9 % IV SOLN
250.0000 mL | Freq: Once | INTRAVENOUS | Status: AC
  Administered 2013-05-01: 250 mL via INTRAVENOUS

## 2013-05-01 MED ORDER — HEPARIN SOD (PORK) LOCK FLUSH 100 UNIT/ML IV SOLN
500.0000 [IU] | Freq: Every day | INTRAVENOUS | Status: AC | PRN
  Administered 2013-05-01: 500 [IU]
  Filled 2013-05-01: qty 5

## 2013-05-01 MED ORDER — LIDOCAINE-PRILOCAINE 2.5-2.5 % EX CREA: TOPICAL_CREAM | CUTANEOUS | Status: DC | PRN

## 2013-05-01 MED ORDER — SODIUM CHLORIDE 0.9 % IJ SOLN
10.0000 mL | INTRAMUSCULAR | Status: AC | PRN
  Administered 2013-05-01: 10 mL
  Filled 2013-05-01: qty 10

## 2013-05-01 NOTE — Patient Instructions (Addendum)
Blood Transfusion Information WHAT IS A BLOOD TRANSFUSION? A transfusion is the replacement of blood or some of its parts. Blood is made up of multiple cells which provide different functions.  Red blood cells carry oxygen and are used for blood loss replacement.  White blood cells fight against infection.  Platelets control bleeding.  Plasma helps clot blood.  Other blood products are available for specialized needs, such as hemophilia or other clotting disorders. BEFORE THE TRANSFUSION  Who gives blood for transfusions?   You may be able to donate blood to be used at a later date on yourself (autologous donation).  Relatives can be asked to donate blood. This is generally not any safer than if you have received blood from a stranger. The same precautions are taken to ensure safety when a relative's blood is donated.  Healthy volunteers who are fully evaluated to make sure their blood is safe. This is blood bank blood. Transfusion therapy is the safest it has ever been in the practice of medicine. Before blood is taken from a donor, a complete history is taken to make sure that person has no history of diseases nor engages in risky social behavior (examples are intravenous drug use or sexual activity with multiple partners). The donor's travel history is screened to minimize risk of transmitting infections, such as malaria. The donated blood is tested for signs of infectious diseases, such as HIV and hepatitis. The blood is then tested to be sure it is compatible with you in order to minimize the chance of a transfusion reaction. If you or a relative donates blood, this is often done in anticipation of surgery and is not appropriate for emergency situations. It takes many days to process the donated blood. RISKS AND COMPLICATIONS Although transfusion therapy is very safe and saves many lives, the main dangers of transfusion include:   Getting an infectious disease.  Developing a  transfusion reaction. This is an allergic reaction to something in the blood you were given. Every precaution is taken to prevent this. The decision to have a blood transfusion has been considered carefully by your caregiver before blood is given. Blood is not given unless the benefits outweigh the risks. AFTER THE TRANSFUSION  Right after receiving a blood transfusion, you will usually feel much better and more energetic. This is especially true if your red blood cells have gotten low (anemic). The transfusion raises the level of the red blood cells which carry oxygen, and this usually causes an energy increase.  The nurse administering the transfusion will monitor you carefully for complications. HOME CARE INSTRUCTIONS  No special instructions are needed after a transfusion. You may find your energy is better. Speak with your caregiver about any limitations on activity for underlying diseases you may have. SEEK MEDICAL CARE IF:   Your condition is not improving after your transfusion.  You develop redness or irritation at the intravenous (IV) site. SEEK IMMEDIATE MEDICAL CARE IF:  Any of the following symptoms occur over the next 12 hours:  Shaking chills.  You have a temperature by mouth above 102 F (38.9 C), not controlled by medicine.  Chest, back, or muscle pain.  People around you feel you are not acting correctly or are confused.  Shortness of breath or difficulty breathing.  Dizziness and fainting.  You get a rash or develop hives.  You have a decrease in urine output.  Your urine turns a dark color or changes to pink, red, or brown. Any of the following   symptoms occur over the next 10 days:  You have a temperature by mouth above 102 F (38.9 C), not controlled by medicine.  Shortness of breath.  Weakness after normal activity.  The white part of the eye turns yellow (jaundice).  You have a decrease in the amount of urine or are urinating less often.  Your  urine turns a dark color or changes to pink, red, or brown. Document Released: 09/15/2000 Document Revised: 12/11/2011 Document Reviewed: 05/04/2008 Exit[employer] Patient Information 2014 [employer].  

## 2013-05-01 NOTE — Progress Notes (Signed)
Attempted to contact patient this am at 0830 to determine which pharmacy to seen EML[employer] script to-cell phone recording says "customer not available/try call later". Will ask when he is back today for 2nd unit blood.

## 2013-05-01 NOTE — Telephone Encounter (Signed)
Biopsy rescheduled for 05/05/13 at 1pm. Needs to arrive at radiology department at [facility] at 10:45 am 05/05/13 Nothing to eat or drink after 7 am on 05/05/13. MUST HAVE A DRIVER  Will relay this information to him today at infusion appointment and will reschedule 05/05/13 office visit to later in week to have results available for MD.

## 2013-05-02 ENCOUNTER — Encounter (HOSPITAL_COMMUNITY): Payer: Self-pay | Admitting: Pharmacy Technician

## 2013-05-02 ENCOUNTER — Other Ambulatory Visit: Payer: Self-pay | Admitting: *Deleted

## 2013-05-02 LAB — TYPE AND SCREEN
ABO/RH(D): B POS
Antibody Screen: NEGATIVE
Unit division: 0
Unit division: 0

## 2013-05-02 NOTE — Progress Notes (Signed)
Biopsy has been rescheduled to 8/4. Per Dr. Truett Perna, move office visit to 8/7 at 3:30 with Misty Stanley, NP. Request sent to schedulers.

## 2013-05-05 ENCOUNTER — Ambulatory Visit (HOSPITAL_COMMUNITY)
Admission: RE | Admit: 2013-05-05 | Discharge: 2013-05-05 | Disposition: A | Discharge status: Home / Self Care | Payer: 59 | Source: Ambulatory Visit | Attending: Oncology | Admitting: Oncology

## 2013-05-05 ENCOUNTER — Ambulatory Visit: Payer: 59 | Admitting: Oncology

## 2013-05-05 ENCOUNTER — Encounter (HOSPITAL_COMMUNITY): Payer: Self-pay

## 2013-05-05 ENCOUNTER — Other Ambulatory Visit: Payer: Self-pay | Admitting: *Deleted

## 2013-05-05 VITALS — BP 110/67 | HR 88 | Temp 99.0°F | Resp 16

## 2013-05-05 DIAGNOSIS — C787 Secondary malignant neoplasm of liver and intrahepatic bile duct: Secondary | ICD-10-CM | POA: Insufficient documentation

## 2013-05-05 DIAGNOSIS — C801 Malignant (primary) neoplasm, unspecified: Secondary | ICD-10-CM | POA: Insufficient documentation

## 2013-05-05 DIAGNOSIS — C189 Malignant neoplasm of colon, unspecified: Secondary | ICD-10-CM

## 2013-05-05 HISTORY — DX: Malignant neoplasm of colon, unspecified: C78.7

## 2013-05-05 HISTORY — DX: Malignant neoplasm of colon, unspecified: C18.9

## 2013-05-05 LAB — CBC
Hemoglobin: 8.6 g/dL — ABNORMAL LOW (ref 13.0–17.0)
MCH: 22.6 pg — ABNORMAL LOW (ref 26.0–34.0)
MCHC: 30 g/dL (ref 30.0–36.0)
MCV: 75.3 fL — ABNORMAL LOW (ref 78.0–100.0)
RBC: 3.81 MIL/uL — ABNORMAL LOW (ref 4.22–5.81)

## 2013-05-05 MED ORDER — HEPARIN SOD (PORK) LOCK FLUSH 100 UNIT/ML IV SOLN: 500.0000 [IU] | INTRAVENOUS | Status: DC | PRN

## 2013-05-05 MED ORDER — FENTANYL CITRATE 0.05 MG/ML IJ SOLN
INTRAMUSCULAR | Status: AC
  Filled 2013-05-05: qty 6

## 2013-05-05 MED ORDER — SODIUM CHLORIDE 0.9 % IV SOLN
INTRAVENOUS | Status: DC
  Administered 2013-05-05: 11:00:00 via INTRAVENOUS

## 2013-05-05 MED ORDER — MIDAZOLAM HCL 2 MG/2ML IJ SOLN
INTRAMUSCULAR | Status: AC | PRN
  Administered 2013-05-05 (×3): 1 mg via INTRAVENOUS

## 2013-05-05 MED ORDER — FENTANYL CITRATE 0.05 MG/ML IJ SOLN
INTRAMUSCULAR | Status: AC | PRN
  Administered 2013-05-05 (×3): 50 ug via INTRAVENOUS

## 2013-05-05 MED ORDER — HEPARIN SOD (PORK) LOCK FLUSH 100 UNIT/ML IV SOLN
500.0000 [IU] | INTRAVENOUS | Status: AC | PRN
  Administered 2013-05-05: 500 [IU]
  Filled 2013-05-05: qty 5

## 2013-05-05 MED ORDER — MIDAZOLAM HCL 2 MG/2ML IJ SOLN
INTRAMUSCULAR | Status: AC
Start: 2013-05-05 — End: 2013-05-05
  Filled 2013-05-05: qty 6
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
## 2013-05-05 NOTE — Progress Notes (Signed)
Offered pt more drink/snack and even offered to order pt a tray and pt declined several times.

## 2013-05-05 NOTE — Procedures (Signed)
Liver Bx L lobe core times three No comp

## 2013-05-06 ENCOUNTER — Telehealth: Payer: Self-pay | Admitting: Oncology

## 2013-05-06 ENCOUNTER — Telehealth: Payer: Self-pay | Admitting: *Deleted

## 2013-05-06 NOTE — Telephone Encounter (Signed)
s.w. pt and advised on 8.7.14 appt...pt ok and aware

## 2013-05-06 NOTE — Telephone Encounter (Signed)
Informed pt I completed his FMLA papers ; sent them back to [employer]. Pt stated understanding.

## 2013-05-08 ENCOUNTER — Ambulatory Visit (HOSPITAL_BASED_OUTPATIENT_CLINIC_OR_DEPARTMENT_OTHER): Payer: 59 | Admitting: Nurse Practitioner

## 2013-05-08 ENCOUNTER — Encounter: Payer: Self-pay | Admitting: Nurse Practitioner

## 2013-05-08 ENCOUNTER — Telehealth: Payer: Self-pay | Admitting: Oncology

## 2013-05-08 ENCOUNTER — Other Ambulatory Visit: Payer: Self-pay | Admitting: *Deleted

## 2013-05-08 ENCOUNTER — Ambulatory Visit: Payer: 59 | Admitting: Nurse Practitioner

## 2013-05-08 VITALS — BP 109/64 | HR 105 | Temp 98.1°F | Resp 18 | Ht 70.0 in | Wt 166.2 lb

## 2013-05-08 DIAGNOSIS — C787 Secondary malignant neoplasm of liver and intrahepatic bile duct: Secondary | ICD-10-CM

## 2013-05-08 DIAGNOSIS — C185 Malignant neoplasm of splenic flexure: Secondary | ICD-10-CM

## 2013-05-08 DIAGNOSIS — C189 Malignant neoplasm of colon, unspecified: Secondary | ICD-10-CM

## 2013-05-08 DIAGNOSIS — J449 Chronic obstructive pulmonary disease, unspecified: Secondary | ICD-10-CM

## 2013-05-08 DIAGNOSIS — D509 Iron deficiency anemia, unspecified: Secondary | ICD-10-CM

## 2013-05-08 DIAGNOSIS — R109 Unspecified abdominal pain: Secondary | ICD-10-CM

## 2013-05-08 DIAGNOSIS — F17200 Nicotine dependence, unspecified, uncomplicated: Secondary | ICD-10-CM

## 2013-05-08 NOTE — Telephone Encounter (Signed)
gv and printed appt sched and avs for pt....emailed [facility] for SW appt to contact pt...MD added tx.Marland KitchenMarland KitchenMarland Kitchen

## 2013-05-08 NOTE — Progress Notes (Signed)
Met with patient to assess for needs.  Referrals made to SW and Dietician.  Has already seen [facility] but was encouraged to re-visit and have questions answered.  Contact information for this RN was verified as patient has it.  Patient denies transportation issues and has family and friends available to assist with care.  Will continue to follow as needed.

## 2013-05-08 NOTE — Progress Notes (Addendum)
OFFICE PROGRESS NOTE  Interval history:  [name] returns as scheduled. He was scheduled for a liver biopsy on 04/30/2013. We were contacted by interventional radiology that the hemoglobin was too low to proceed at 6.5. He was transfused 2 units of blood and our office and also received a dose of IV iron. The liver biopsy was done on 05/05/2013. Pathology showed adenocarcinoma positive for cytokeratin 20 and CDX-2 and negative for cytokeratin 7, napsin-A and TTF-1. The morphologic and immunophenotypic findings were consistent with metastatic colorectal adenocarcinoma.  He feels better since the blood transfusion. He notes shortness of breath with exertion. He thinks part of the shortness of breath is related to "COPD". He denies hematochezia or melena. He has intermittent right-sided abdominal pain.   Objective: Blood pressure 109/64, pulse 105, temperature 98.1 F (36.7 C), temperature source Oral, resp. rate 18, height 5\' 10"  (1.778 m), weight 166 lb 3.2 oz (75.388 kg), SpO2 99.00%.  No thrush. Lungs clear. Regular cardiac rhythm. Abdomen soft. Tender over the right upper abdomen. Question palpable liver edge. No edema.  Lab Results: Lab Results  Component Value Date   WBC 15.5* 05/05/2013   HGB 8.6* 05/05/2013   HCT 28.7* 05/05/2013   MCV 75.3* 05/05/2013   PLT 552* 05/05/2013    Chemistry:    Chemistry      Component Value Date/Time   NA 139 03/14/2013 1023   K 4.0 03/14/2013 1023   CL 104 03/14/2013 1023   CO2 22 03/14/2013 1023   BUN 10 03/14/2013 1023   CREATININE 0.7 03/14/2013 1023      Component Value Date/Time   CALCIUM 9.1 03/14/2013 1023   ALKPHOS 82 03/14/2013 1023   AST 33 03/14/2013 1023   ALT 26 03/14/2013 1023   BILITOT 0.5 03/14/2013 1023       Studies/Results: Ct Chest W Contrast  04/15/2013   *RADIOLOGY REPORT*  Clinical Data:  Colon cancer  CT CHEST, ABDOMEN AND PELVIS WITH CONTRAST  Technique:  Multidetector CT imaging of the chest, abdomen and pelvis was  performed following the standard protocol during bolus administration of intravenous contrast.  Contrast: , OMNIPAQUE IOHEXOL 300 MG/ML  SOLN  Comparison:   None.  CT CHEST  Findings:  There is no pleural effusion identified.  There is a tiny nodule in the left lower lobe measuring 5 mm, image 43/series 3.  Scar versus atelectasis is noted in the posterior left lung base.  Normal heart size.  No pericardial effusion.  There is no enlarged mediastinal or hilar lymph nodes.  The trachea appears patent and is midline.  The esophagus appears normal.  No enlarged axillary or supraclavicular lymph nodes.  Review of the visualized osseous structures demonstrates no worrisome lytic or sclerotic bone lesions.  IMPRESSION:  1.  Small nonspecific pulmonary nodule is noted within the left lower lobe.  2.  No specific features identified to suggest metastasis to the chest.  CT ABDOMEN AND PELVIS  Findings:  Extensive multi focal liver metastasis identified.  The dominant lesion involves the left hepatic lobe and measures 12.1 cm, image 53/series 2.  Within the posterior lateral right hepatic lobe there is a 6.8 cm low attenuation lesion, image 67/series 2. The gallbladder appears normal.  There is no biliary dilatation. Normal appearance of the pancreas.  The spleen is unremarkable.  The adrenal glands are both normal.  The right kidney is normal. The left kidney is normal.  The urinary bladder is unremarkable.  Calcified atherosclerotic disease affects the  abdominal aorta. There is no aneurysm. Extensive upper abdominal adenopathy is identified.  Gastrohepatic ligament lymph node measures 1.4 cm, image 60/series 2.  There is a peripancreatic lymph node which measures 2.2 cm, image 64/series 2.  Portocaval lymph node measures 1.8 cm, image 70/series 2.  Right lower quadrant ileocolic lymph node is identified measuring 1.7 cm, image 85/series 2.  There is no pelvic or inguinal adenopathy identified.  No free fluid or fluid  collections identified.  The stomach is within normal limits.  The small bowel loops have a normal course and caliber.  No obstruction.  Normal appearance of the appendix.  The large, constricting mass involving the hepatic flexure is identified.  This is approximately 6.9 cm in length and measures 4 cm in width. There is a large ulcer associated this mass with direct extension into the inferior right hepatic lobe, image 44/series 602.  There may also be direct invasion into the descending portion of the duodenum, image 71/series 2.  There is no evidence for bowel obstruction.  Review of the visualized osseous structures is unremarkable. No aggressive lytic or sclerotic bone lesions identified.  IMPRESSION:  1.  Aggressive tumor involving the hepatic flexure is identified. There is associated ulceration with direct invasion of the right hepatic lobe.  There may also be early involvement of the descending duodenum. 2.  Extensive liver metastasis. 3.  Extensive upper abdominal metastatic adenopathy.   Original Report Authenticated By: Signa Kell, M.D.   Ct Abdomen Pelvis W Contrast  04/15/2013   *RADIOLOGY REPORT*  Clinical Data:  Colon cancer  CT CHEST, ABDOMEN AND PELVIS WITH CONTRAST  Technique:  Multidetector CT imaging of the chest, abdomen and pelvis was performed following the standard protocol during bolus administration of intravenous contrast.  Contrast: , OMNIPAQUE IOHEXOL 300 MG/ML  SOLN  Comparison:   None.  CT CHEST  Findings:  There is no pleural effusion identified.  There is a tiny nodule in the left lower lobe measuring 5 mm, image 43/series 3.  Scar versus atelectasis is noted in the posterior left lung base.  Normal heart size.  No pericardial effusion.  There is no enlarged mediastinal or hilar lymph nodes.  The trachea appears patent and is midline.  The esophagus appears normal.  No enlarged axillary or supraclavicular lymph nodes.  Review of the visualized osseous structures  demonstrates no worrisome lytic or sclerotic bone lesions.  IMPRESSION:  1.  Small nonspecific pulmonary nodule is noted within the left lower lobe.  2.  No specific features identified to suggest metastasis to the chest.  CT ABDOMEN AND PELVIS  Findings:  Extensive multi focal liver metastasis identified.  The dominant lesion involves the left hepatic lobe and measures 12.1 cm, image 53/series 2.  Within the posterior lateral right hepatic lobe there is a 6.8 cm low attenuation lesion, image 67/series 2. The gallbladder appears normal.  There is no biliary dilatation. Normal appearance of the pancreas.  The spleen is unremarkable.  The adrenal glands are both normal.  The right kidney is normal. The left kidney is normal.  The urinary bladder is unremarkable.  Calcified atherosclerotic disease affects the abdominal aorta. There is no aneurysm. Extensive upper abdominal adenopathy is identified.  Gastrohepatic ligament lymph node measures 1.4 cm, image 60/series 2.  There is a peripancreatic lymph node which measures 2.2 cm, image 64/series 2.  Portocaval lymph node measures 1.8 cm, image 70/series 2.  Right lower quadrant ileocolic lymph node is identified measuring 1.7 cm, image  85/series 2.  There is no pelvic or inguinal adenopathy identified.  No free fluid or fluid collections identified.  The stomach is within normal limits.  The small bowel loops have a normal course and caliber.  No obstruction.  Normal appearance of the appendix.  The large, constricting mass involving the hepatic flexure is identified.  This is approximately 6.9 cm in length and measures 4 cm in width. There is a large ulcer associated this mass with direct extension into the inferior right hepatic lobe, image 44/series 602.  There may also be direct invasion into the descending portion of the duodenum, image 71/series 2.  There is no evidence for bowel obstruction.  Review of the visualized osseous structures is unremarkable. No  aggressive lytic or sclerotic bone lesions identified.  IMPRESSION:  1.  Aggressive tumor involving the hepatic flexure is identified. There is associated ulceration with direct invasion of the right hepatic lobe.  There may also be early involvement of the descending duodenum. 2.  Extensive liver metastasis. 3.  Extensive upper abdominal metastatic adenopathy.   Original Report Authenticated By: Signa Kell, M.D.   US Biopsy  05/05/2013   *RADIOLOGY REPORT*  Clinical Data/Indication: Colon and liver lesions.  ULTRASOUND-GUIDED BIOPSY OF A LEFT LOBE LIVER LESION.  CORE.  Sedation: Versed 3.0 mg, Fentanyl 150 mcg.  Total Moderate Sedation Time: 10 minutes.  Procedure: The procedure, risks, benefits, and alternatives were explained to the patient. Questions regarding the procedure were encouraged and answered. The patient understands and consents to the procedure.  The epigastrum was prepped with betadine in a sterile fashion, and a sterile drape was applied covering the operative field. A sterile gown and sterile gloves were used for the procedure.  Under sonographic guidance, the 17 gauge needle was inserted into a lesion within the left lobe of the liver.  Three 18 gauge core biopsies were obtained.  Gelfoam slurry was injected into the tract. Final imaging was performed.  Patient tolerated the procedure well without complication.  Vital sign monitoring by nursing staff during the procedure will continue as patient is in the special procedures unit for post procedure observation.  Findings: The images document guide needle placement within the left lobe liver lesion. Post biopsy images demonstrate no hemorrhage.  IMPRESSION: Successful ultrasound-guided core biopsy of a left lobe liver lesion.   Original Report Authenticated By: Jolaine Click, M.D.   Ir Fluoro Guide Cv Line Right  04/28/2013   *RADIOLOGY REPORT*  Clinical Data:  Colon cancer  ULTRASOUND GUIDANCE FOR VASCULAR ACCESS RIGHT INTERNAL JUGULAR  SINGLE LUMEN POWER PORT CATHETER INSERTION  Date: 04/28/2013 11:30:00  Radiologist:  M. Ruel Favors, M.D.  Medications:  2 grams ancefadministered within 1 hour of the procedure.4 mg Versed, 2 mcg Fentanyl  Guidance:  Ultrasound and fluoroscopic  Fluoroscopy time:  42 seconds  Sedation time:  35 minutes  Contrast volume:  None.  Complications:  No immediate  PROCEDURE/FINDINGS:  Informed consent was obtained from the patient following explanation of the procedure, risks, benefits and alternatives. The patient understands, agrees and consents for the procedure. All questions were addressed.  A time out was performed.  Maximal barrier sterile technique utilized including caps, mask, sterile gowns, sterile gloves, large sterile drape, hand hygiene, and 2% chlorhexidine scrub.  Under sterile conditions and local anesthesia, right internal jugular micropuncture venous access was performed.  Access was performed with ultrasound.  Images were obtained for documentation. A guide wire was inserted followed by a transitional dilator.  This allowed  insertion of a guide wire and catheter into the IVC. Measurements were obtained from the SVC / RA junction back to the right IJ venotomy site.  In the right infraclavicular chest, a subcutaneous pocket was created over the second anterior rib.  This was done under sterile conditions and local anesthesia.  1% lidocaine with epinephrine was utilized for this.  A 2.5 cm incision was made in the skin.  Blunt dissection was performed to create a subcutaneous pocket over the right pectoralis major muscle.  The pocket was flushed with saline vigorously.  There was adequate hemostasis.  The port catheter was assembled and checked for leakage.  The port catheter was secured in the pocket with two retention sutures.  The tubing was tunneled subcutaneously to the right venotomy site and inserted into the SVC/RA junction through a valved peel-away sheath.  Position was confirmed with  fluoroscopy. Images were obtained for documentation.  The patient tolerated the procedure well.  No immediate complications.  Incisions were closed in a two layer fashion with 4 - 0 Vicryl suture.  Dermabond was applied to the skin. The port catheter was accessed, blood was aspirated followed by saline and heparin flushes.  Needle was removed.  A dry sterile dressing was applied.  IMPRESSION: Ultrasound and fluoroscopically guided right internal jugular single lumen power port catheter insertion.  Tip in the SVC/RA junction.  Catheter ready for use.   Original Report Authenticated By: Judie Petit. Miles Costain, M.D.   Ir US Guide Vasc Access Right  04/28/2013   *RADIOLOGY REPORT*  Clinical Data:  Colon cancer  ULTRASOUND GUIDANCE FOR VASCULAR ACCESS RIGHT INTERNAL JUGULAR SINGLE LUMEN POWER PORT CATHETER INSERTION  Date: 04/28/2013 11:30:00  Radiologist:  M. Ruel Favors, M.D.  Medications:  2 grams ancefadministered within 1 hour of the procedure.4 mg Versed, 2 mcg Fentanyl  Guidance:  Ultrasound and fluoroscopic  Fluoroscopy time:  42 seconds  Sedation time:  35 minutes  Contrast volume:  None.  Complications:  No immediate  PROCEDURE/FINDINGS:  Informed consent was obtained from the patient following explanation of the procedure, risks, benefits and alternatives. The patient understands, agrees and consents for the procedure. All questions were addressed.  A time out was performed.  Maximal barrier sterile technique utilized including caps, mask, sterile gowns, sterile gloves, large sterile drape, hand hygiene, and 2% chlorhexidine scrub.  Under sterile conditions and local anesthesia, right internal jugular micropuncture venous access was performed.  Access was performed with ultrasound.  Images were obtained for documentation. A guide wire was inserted followed by a transitional dilator.  This allowed insertion of a guide wire and catheter into the IVC. Measurements were obtained from the SVC / RA junction back to the right  IJ venotomy site.  In the right infraclavicular chest, a subcutaneous pocket was created over the second anterior rib.  This was done under sterile conditions and local anesthesia.  1% lidocaine with epinephrine was utilized for this.  A 2.5 cm incision was made in the skin.  Blunt dissection was performed to create a subcutaneous pocket over the right pectoralis major muscle.  The pocket was flushed with saline vigorously.  There was adequate hemostasis.  The port catheter was assembled and checked for leakage.  The port catheter was secured in the pocket with two retention sutures.  The tubing was tunneled subcutaneously to the right venotomy site and inserted into the SVC/RA junction through a valved peel-away sheath.  Position was confirmed with fluoroscopy. Images were obtained for documentation.  The  patient tolerated the procedure well.  No immediate complications.  Incisions were closed in a two layer fashion with 4 - 0 Vicryl suture.  Dermabond was applied to the skin. The port catheter was accessed, blood was aspirated followed by saline and heparin flushes.  Needle was removed.  A dry sterile dressing was applied.  IMPRESSION: Ultrasound and fluoroscopically guided right internal jugular single lumen power port catheter insertion.  Tip in the SVC/RA junction.  Catheter ready for use.   Original Report Authenticated By: Judie Petit. Miles Costain, M.D.    Medications: I have reviewed the patient's current medications.  Assessment/Plan:  1. Hepatic flexure mass with extensive liver metastases. Status post a colonoscopic biopsy on 04/14/2013 confirming at least high-grade glandular dysplasia.   Status post liver biopsy 05/05/2013 confirming metastatic colorectal adenocarcinoma. 2. Microcytic anemia secondary to #1. He was transfused with PRBCs on 04/30/2013 when the hemoglobin returned at 6.5. He was also given a dose of IV iron. He continues oral iron. 3. Hepatitis C positive. 4. Weight  loss. 5. COPD. 6. Tobacco use. 7. History of alcohol use.  Disposition-Mr. Jacquin has been diagnosed with metastatic colon cancer involving the liver. Dr. Truett Perna recommends proceeding with treatment on the FOLFOX regimen. We reviewed potential toxicities including myelosuppression, nausea, mouth sores, diarrhea. We discussed the neurotoxicity associated with oxaliplatin including cold sensitivity, peripheral neuropathy, acute laryngopharyngeal dysesthesia and other more rare occurrences such as diplopia and ataxia. We also discussed the potential for an allergic reaction. We discussed the skin hyperpigmentation and hand-foot syndrome associated with 5-fluorouracil. He will attend a chemotherapy education class. We will schedule him to begin cycle 1 FOLFOX on 05/13/2013.  He recently developed progressive anemia and required transfusion support. We will monitor the hemoglobin closely and continue to provide transfusion support as needed. Dr. Truett Perna discussed the possibility of surgery to resect the primary tumor if he develops gross bleeding or we are unable to maintain an adequate hemoglobin with transfusion support.  We will see him in followup prior to cycle 2 FOLFOX on 05/27/2013.  Patient seen with Dr. Truett Perna.  Lonna Cobb ANP/GNP-BC   The liver biopsy confirmed metastatic colon cancer. There was a significant drop in his hemoglobin prior to undergoing the biopsy and he was transfused with packed red blood cells. He denies bleeding. His energy improved after the red cell transfusion.  I recommend treatment with FOLFOX. We discussed the FOLFOX regimen today. We reviewed the potential toxicities associated with this chemotherapy regimen including the various types of neuropathy seen with oxaliplatin. We will consider adding Avastin after 1 or 2 cycles of FOLFOX if the hemoglobin remains stable and there is no evidence of gross bleeding. Mr. Strojny will attend a chemotherapy teaching  class with the plan to begin FOLFOX within the next one week.  Mancel Bale, M.D.

## 2013-05-09 ENCOUNTER — Encounter: Payer: Self-pay | Admitting: Oncology

## 2013-05-09 NOTE — Progress Notes (Signed)
Faxed disability form to [facility] [employer] @ [phone].

## 2013-05-12 ENCOUNTER — Other Ambulatory Visit: Payer: Self-pay | Admitting: *Deleted

## 2013-05-12 ENCOUNTER — Ambulatory Visit: Payer: 59 | Admitting: Lab

## 2013-05-12 ENCOUNTER — Other Ambulatory Visit: Payer: 59 | Admitting: Lab

## 2013-05-12 ENCOUNTER — Ambulatory Visit (HOSPITAL_BASED_OUTPATIENT_CLINIC_OR_DEPARTMENT_OTHER): Payer: 59

## 2013-05-12 ENCOUNTER — Other Ambulatory Visit: Payer: Self-pay | Admitting: Nurse Practitioner

## 2013-05-12 DIAGNOSIS — C787 Secondary malignant neoplasm of liver and intrahepatic bile duct: Secondary | ICD-10-CM

## 2013-05-12 DIAGNOSIS — C189 Malignant neoplasm of colon, unspecified: Secondary | ICD-10-CM

## 2013-05-12 DIAGNOSIS — Z452 Encounter for adjustment and management of vascular access device: Secondary | ICD-10-CM

## 2013-05-12 LAB — CBC WITH DIFFERENTIAL/PLATELET
Basophils Absolute: 0.1 10*3/uL (ref 0.0–0.1)
Eosinophils Absolute: 0.1 10*3/uL (ref 0.0–0.5)
HGB: 10.2 g/dL — ABNORMAL LOW (ref 13.0–17.1)
MCV: 73.9 fL — ABNORMAL LOW (ref 79.3–98.0)
MONO#: 2.5 10*3/uL — ABNORMAL HIGH (ref 0.1–0.9)
MONO%: 12.2 % (ref 0.0–14.0)
NEUT#: 15.6 10*3/uL — ABNORMAL HIGH (ref 1.5–6.5)
Platelets: 535 10*3/uL — ABNORMAL HIGH (ref 140–400)
RBC: 4.36 10*6/uL (ref 4.20–5.82)
RDW: 27.8 % — ABNORMAL HIGH (ref 11.0–14.6)
WBC: 20.4 10*3/uL — ABNORMAL HIGH (ref 4.0–10.3)

## 2013-05-12 LAB — TECHNOLOGIST REVIEW

## 2013-05-12 MED ORDER — SODIUM CHLORIDE 0.9 % IJ SOLN
10.0000 mL | INTRAMUSCULAR | Status: DC | PRN
  Administered 2013-05-12: 10 mL via INTRAVENOUS
  Filled 2013-05-12: qty 10

## 2013-05-12 MED ORDER — TRAMADOL HCL 50 MG PO TABS: 50.0000 mg | ORAL_TABLET | Freq: Four times a day (QID) | ORAL | Status: DC | PRN

## 2013-05-12 MED ORDER — PROCHLORPERAZINE MALEATE 10 MG PO TABS: 10.0000 mg | ORAL_TABLET | Freq: Four times a day (QID) | ORAL | Status: DC | PRN

## 2013-05-12 MED ORDER — HEPARIN SOD (PORK) LOCK FLUSH 100 UNIT/ML IV SOLN
500.0000 [IU] | Freq: Once | INTRAVENOUS | Status: AC
  Administered 2013-05-12: 500 [IU] via INTRAVENOUS
  Filled 2013-05-12: qty 5

## 2013-05-12 NOTE — Patient Instructions (Addendum)

## 2013-05-14 ENCOUNTER — Ambulatory Visit (HOSPITAL_BASED_OUTPATIENT_CLINIC_OR_DEPARTMENT_OTHER): Payer: 59

## 2013-05-14 ENCOUNTER — Other Ambulatory Visit: Payer: Self-pay | Admitting: Oncology

## 2013-05-14 ENCOUNTER — Other Ambulatory Visit (HOSPITAL_BASED_OUTPATIENT_CLINIC_OR_DEPARTMENT_OTHER): Payer: 59 | Admitting: Lab

## 2013-05-14 ENCOUNTER — Ambulatory Visit: Payer: 59 | Admitting: Nutrition

## 2013-05-14 ENCOUNTER — Encounter: Payer: Self-pay | Admitting: *Deleted

## 2013-05-14 VITALS — BP 100/66 | HR 114 | Temp 97.8°F | Resp 20

## 2013-05-14 DIAGNOSIS — C787 Secondary malignant neoplasm of liver and intrahepatic bile duct: Secondary | ICD-10-CM

## 2013-05-14 DIAGNOSIS — C189 Malignant neoplasm of colon, unspecified: Secondary | ICD-10-CM

## 2013-05-14 DIAGNOSIS — C185 Malignant neoplasm of splenic flexure: Secondary | ICD-10-CM

## 2013-05-14 DIAGNOSIS — Z5111 Encounter for antineoplastic chemotherapy: Secondary | ICD-10-CM

## 2013-05-14 LAB — COMPREHENSIVE METABOLIC PANEL (CC13)
ALT: 22 U/L (ref 0–55)
Albumin: 2.2 g/dL — ABNORMAL LOW (ref 3.5–5.0)
CO2: 22 mEq/L (ref 22–29)
Calcium: 8.9 mg/dL (ref 8.4–10.4)
Chloride: 97 mEq/L — ABNORMAL LOW (ref 98–109)
Glucose: 106 mg/dl (ref 70–140)
Sodium: 129 mEq/L — ABNORMAL LOW (ref 136–145)
Total Bilirubin: 0.92 mg/dL (ref 0.20–1.20)
Total Protein: 8.2 g/dL (ref 6.4–8.3)

## 2013-05-14 MED ORDER — ONDANSETRON 8 MG/50ML IVPB (CHCC)
8.0000 mg | Freq: Once | INTRAVENOUS | Status: AC
  Administered 2013-05-14: 8 mg via INTRAVENOUS

## 2013-05-14 MED ORDER — OXALIPLATIN CHEMO INJECTION 100 MG/20ML
85.0000 mg/m2 | Freq: Once | INTRAVENOUS | Status: AC
  Administered 2013-05-14: 165 mg via INTRAVENOUS
  Filled 2013-05-14: qty 33

## 2013-05-14 MED ORDER — DEXTROSE 5 % IV SOLN
Freq: Once | INTRAVENOUS | Status: AC
  Administered 2013-05-14: 10:00:00 via INTRAVENOUS

## 2013-05-14 MED ORDER — DEXAMETHASONE SODIUM PHOSPHATE 10 MG/ML IJ SOLN
10.0000 mg | Freq: Once | INTRAMUSCULAR | Status: AC
  Administered 2013-05-14: 10 mg via INTRAVENOUS

## 2013-05-14 MED ORDER — SODIUM CHLORIDE 0.9 % IV SOLN
2400.0000 mg/m2 | INTRAVENOUS | Status: DC
  Administered 2013-05-14: 4650 mg via INTRAVENOUS
  Filled 2013-05-14: qty 93

## 2013-05-14 MED ORDER — FLUOROURACIL CHEMO INJECTION 2.5 GM/50ML
400.0000 mg/m2 | Freq: Once | INTRAVENOUS | Status: AC
  Administered 2013-05-14: 750 mg via INTRAVENOUS
  Filled 2013-05-14: qty 15

## 2013-05-14 MED ORDER — LEUCOVORIN CALCIUM INJECTION 350 MG
400.0000 mg/m2 | Freq: Once | INTRAVENOUS | Status: AC
  Administered 2013-05-14: 772 mg via INTRAVENOUS
  Filled 2013-05-14: qty 38.6

## 2013-05-14 NOTE — Patient Instructions (Signed)
[facility] Discharge Instructions for Patients Receiving Chemotherapy  Today you received the following chemotherapy agents FOLFOX  To help prevent nausea and vomiting after your treatment, we encourage you to take your nausea medication as ordered   If you develop nausea and vomiting that is not controlled by your nausea medication, call the clinic.   BELOW ARE SYMPTOMS THAT SHOULD BE REPORTED IMMEDIATELY:  *FEVER GREATER THAN 100.5 F  *CHILLS WITH OR WITHOUT FEVER  NAUSEA AND VOMITING THAT IS NOT CONTROLLED WITH YOUR NAUSEA MEDICATION  *UNUSUAL SHORTNESS OF BREATH  *UNUSUAL BRUISING OR BLEEDING  TENDERNESS IN MOUTH AND THROAT WITH OR WITHOUT PRESENCE OF ULCERS  *URINARY PROBLEMS  *BOWEL PROBLEMS  UNUSUAL RASH Items with * indicate a potential emergency and should be followed up as soon as possible.  Feel free to call the clinic you have any questions or concerns. The clinic phone number is [phone].

## 2013-05-14 NOTE — Progress Notes (Signed)
Patient is a 57 year old male diagnosed with metastatic colon cancer to the liver.  He is a patient of Dr. Truett Perna.  Past medical history includes anemia, hepatitis C, COPD, weight loss, tobacco, and alcohol usage.  Medications include Colace, ferrous sulfate, multivitamin, Prilosec, and Compazine.  Labs include sodium 129 and albumin 2.2 on August 13.  Height: 5 feet 10 inches. Weight: 166.2 pounds August 7. Usual body weight 179 pounds 03/14/2013. BMI: 23.85.  Patient reports poor appetite and inadequate oral intake.  He denies nausea and vomiting.  He reports lactose intolerance.  He has tried some ensure and tolerated it well.  Patient states it's difficult to fix foods, and then eat them after he has prepared them.  Nutrition diagnosis: Unintentional weight loss related to poor appetite and inadequate oral intake as evidenced by 7% weight loss in 6 weeks.  Intervention: Patient was educated to consume small amounts of food more often throughout the day.  I've educated him on ways to add calories and protein at meals and snacks.  I've encouraged patient to consume oral nutrition supplements 3 times a day between meals.  I've also reviewed easy foods to purchase, so that patient does not have to to prepare foods.  We've discussed the importance of him taking medications as prescribed.  I have provided samples for patient to try and take with him today.  Fact sheets also provided.  Questions answered.  Teach back method used.  Monitoring, evaluation, goals: Patient will tolerate increased calories and protein with oral nutrition supplements 3 times a day between meals to minimize further weight loss.  Next visit: Tuesday, August 26, during chemotherapy.

## 2013-05-14 NOTE — Progress Notes (Signed)
[facility] Psychosocial Distress Screening Clinical Social Work  Clinical Social Work was referred by distress screening protocol.  The patient scored a 7 on the Psychosocial Distress Thermometer which indicates  moderate distress. Clinical Social Worker met with patient in infusion room to assess for distress and other psychosocial needs. Tony Barajas reports feeling confident in treatment plan and no emotional issues.  He states his biggest cause of distress at this time is waiting for his short term disability to begin.  Tony Barajas is concerned regarding his medical bills, CSW encouraged him to meet with his financial advocate.  Patient also interested in completing healthcare advance directives and learning more about donating his body to research after he passes. Patient plans to call CSW to set up appointment time.   Clinical Social Worker follow up needed: yes  If yes, follow up plan: CSW awaiting patient phone call   Kathrin Penner, MSW, LCSW Clinical Social Worker [facility] [phone]

## 2013-05-16 ENCOUNTER — Telehealth: Payer: Self-pay | Admitting: Dietician

## 2013-05-16 ENCOUNTER — Ambulatory Visit (HOSPITAL_BASED_OUTPATIENT_CLINIC_OR_DEPARTMENT_OTHER): Payer: 59

## 2013-05-16 VITALS — BP 117/78 | HR 110 | Temp 97.0°F | Resp 20

## 2013-05-16 DIAGNOSIS — C189 Malignant neoplasm of colon, unspecified: Secondary | ICD-10-CM

## 2013-05-16 DIAGNOSIS — C787 Secondary malignant neoplasm of liver and intrahepatic bile duct: Secondary | ICD-10-CM

## 2013-05-16 DIAGNOSIS — Z452 Encounter for adjustment and management of vascular access device: Secondary | ICD-10-CM

## 2013-05-16 MED ORDER — SODIUM CHLORIDE 0.9 % IJ SOLN
10.0000 mL | INTRAMUSCULAR | Status: DC | PRN
  Administered 2013-05-16: 10 mL
  Filled 2013-05-16: qty 10

## 2013-05-16 MED ORDER — HEPARIN SOD (PORK) LOCK FLUSH 100 UNIT/ML IV SOLN
500.0000 [IU] | Freq: Once | INTRAVENOUS | Status: AC | PRN
  Administered 2013-05-16: 500 [IU]
  Filled 2013-05-16: qty 5

## 2013-05-16 NOTE — Patient Instructions (Addendum)
Today you had your portacath flushed.  Call for temperature >100.5 or any nausea/vomitingnot controlled by your nausea medicine. 

## 2013-05-20 ENCOUNTER — Other Ambulatory Visit: Payer: Self-pay | Admitting: Certified Registered Nurse Anesthetist

## 2013-05-21 ENCOUNTER — Other Ambulatory Visit: Payer: Self-pay | Admitting: Oncology

## 2013-05-21 DIAGNOSIS — C189 Malignant neoplasm of colon, unspecified: Secondary | ICD-10-CM

## 2013-05-25 ENCOUNTER — Other Ambulatory Visit: Payer: Self-pay | Admitting: Oncology

## 2013-05-27 ENCOUNTER — Telehealth: Payer: Self-pay | Admitting: *Deleted

## 2013-05-27 ENCOUNTER — Ambulatory Visit: Payer: 59 | Admitting: Nutrition

## 2013-05-27 ENCOUNTER — Ambulatory Visit (HOSPITAL_BASED_OUTPATIENT_CLINIC_OR_DEPARTMENT_OTHER): Payer: 59 | Admitting: Nurse Practitioner

## 2013-05-27 ENCOUNTER — Ambulatory Visit (HOSPITAL_BASED_OUTPATIENT_CLINIC_OR_DEPARTMENT_OTHER): Payer: 59

## 2013-05-27 ENCOUNTER — Ambulatory Visit (HOSPITAL_BASED_OUTPATIENT_CLINIC_OR_DEPARTMENT_OTHER): Payer: 59 | Admitting: Lab

## 2013-05-27 ENCOUNTER — Telehealth: Payer: Self-pay

## 2013-05-27 VITALS — BP 100/70 | HR 110 | Temp 98.9°F | Resp 18 | Ht 70.0 in | Wt 165.2 lb

## 2013-05-27 DIAGNOSIS — Z5111 Encounter for antineoplastic chemotherapy: Secondary | ICD-10-CM

## 2013-05-27 DIAGNOSIS — J449 Chronic obstructive pulmonary disease, unspecified: Secondary | ICD-10-CM

## 2013-05-27 DIAGNOSIS — C787 Secondary malignant neoplasm of liver and intrahepatic bile duct: Secondary | ICD-10-CM

## 2013-05-27 DIAGNOSIS — C183 Malignant neoplasm of hepatic flexure: Secondary | ICD-10-CM

## 2013-05-27 DIAGNOSIS — C189 Malignant neoplasm of colon, unspecified: Secondary | ICD-10-CM

## 2013-05-27 LAB — CBC WITH DIFFERENTIAL/PLATELET
BASO%: 0.2 % (ref 0.0–2.0)
Eosinophils Absolute: 0.3 10*3/uL (ref 0.0–0.5)
LYMPH%: 19.9 % (ref 14.0–49.0)
MCHC: 30.4 g/dL — ABNORMAL LOW (ref 32.0–36.0)
MCV: 79.9 fL (ref 79.3–98.0)
MONO#: 1.6 10*3/uL — ABNORMAL HIGH (ref 0.1–0.9)
MONO%: 16 % — ABNORMAL HIGH (ref 0.0–14.0)
NEUT#: 5.8 10*3/uL (ref 1.5–6.5)
Platelets: 333 10*3/uL (ref 140–400)
RBC: 3.83 10*6/uL — ABNORMAL LOW (ref 4.20–5.82)
RDW: 25.9 % — ABNORMAL HIGH (ref 11.0–14.6)
WBC: 9.7 10*3/uL (ref 4.0–10.3)
nRBC: 0 % (ref 0–0)

## 2013-05-27 LAB — COMPREHENSIVE METABOLIC PANEL (CC13)
ALT: 23 U/L (ref 0–55)
AST: 37 U/L — ABNORMAL HIGH (ref 5–34)
CO2: 23 mEq/L (ref 22–29)
Creatinine: 0.6 mg/dL — ABNORMAL LOW (ref 0.7–1.3)
Sodium: 132 mEq/L — ABNORMAL LOW (ref 136–145)
Total Bilirubin: 0.34 mg/dL (ref 0.20–1.20)
Total Protein: 7.4 g/dL (ref 6.4–8.3)

## 2013-05-27 MED ORDER — OXALIPLATIN CHEMO INJECTION 100 MG/20ML
85.0000 mg/m2 | Freq: Once | INTRAVENOUS | Status: AC
  Administered 2013-05-27: 165 mg via INTRAVENOUS
  Filled 2013-05-27: qty 33

## 2013-05-27 MED ORDER — FLUOROURACIL CHEMO INJECTION 2.5 GM/50ML
400.0000 mg/m2 | Freq: Once | INTRAVENOUS | Status: AC
  Administered 2013-05-27: 750 mg via INTRAVENOUS
  Filled 2013-05-27: qty 15

## 2013-05-27 MED ORDER — DEXAMETHASONE SODIUM PHOSPHATE 10 MG/ML IJ SOLN
10.0000 mg | Freq: Once | INTRAMUSCULAR | Status: AC
  Administered 2013-05-27: 10 mg via INTRAVENOUS

## 2013-05-27 MED ORDER — LEUCOVORIN CALCIUM INJECTION 350 MG
400.0000 mg/m2 | Freq: Once | INTRAVENOUS | Status: AC
  Administered 2013-05-27: 772 mg via INTRAVENOUS
  Filled 2013-05-27: qty 38.6

## 2013-05-27 MED ORDER — SODIUM CHLORIDE 0.9 % IJ SOLN
10.0000 mL | INTRAMUSCULAR | Status: DC | PRN
  Filled 2013-05-27: qty 10

## 2013-05-27 MED ORDER — HEPARIN SOD (PORK) LOCK FLUSH 100 UNIT/ML IV SOLN
500.0000 [IU] | Freq: Once | INTRAVENOUS | Status: DC | PRN
  Filled 2013-05-27: qty 5

## 2013-05-27 MED ORDER — DEXTROSE 5 % IV SOLN
Freq: Once | INTRAVENOUS | Status: AC
  Administered 2013-05-27: 13:00:00 via INTRAVENOUS

## 2013-05-27 MED ORDER — ONDANSETRON 8 MG/50ML IVPB (CHCC)
8.0000 mg | Freq: Once | INTRAVENOUS | Status: AC
  Administered 2013-05-27: 8 mg via INTRAVENOUS

## 2013-05-27 MED ORDER — SODIUM CHLORIDE 0.9 % IV SOLN
2400.0000 mg/m2 | INTRAVENOUS | Status: DC
  Administered 2013-05-27: 4650 mg via INTRAVENOUS
  Filled 2013-05-27: qty 93

## 2013-05-27 NOTE — Telephone Encounter (Signed)
Per staff message and POF I have scheduled appts.  JMW  

## 2013-05-27 NOTE — Telephone Encounter (Signed)
gv and printed appt sched and avs for pt....MW added tx   °

## 2013-05-27 NOTE — Progress Notes (Signed)
Patient denies N/V/D or mouth sores.  He is eating better now that he relies on more convenience foods.  Patient complained of nausea but reports it is resolved now that he takes his nausea medication.  He states he gets nausea when he stops taking nausea medication. Weight decreased slightly to 165.2 pounds on August 26 from 166.2 pounds August 7 and 199 pounds in March 2014.  Nutrition Diagnosis:   Unintentional weight loss continues but has improved.  Intervention:  Continue high calorie, high protein foods with Ensure PlusTID between meals. Continue easy to prepare convenience foods as needed during treatments. Nausea medications as prescribed/as needed. Teach back method used.  Monitoring, Evaluation, Goals: Patient will minimize weight loss throughout treatment. Ensure Plus TID between meals.  Next Visit: Tuesday, September 9, during chemotherapy.

## 2013-05-27 NOTE — Progress Notes (Signed)
No Avastin today per Lonna Cobb

## 2013-05-27 NOTE — Progress Notes (Signed)
OFFICE PROGRESS NOTE  Interval history:  [name] returns as scheduled. He completed cycle 1 FOLFOX on 05/14/2013. He denies nausea/vomiting. No mouth sores. No diarrhea. Cold sensitivity lasted one week. He has a good appetite. He denies bleeding. He continues to have intermittent right-sided abdominal pain.   Objective: Blood pressure 100/70, pulse 110, temperature 98.9 F (37.2 C), temperature source Oral, resp. rate 18, height 5\' 10"  (1.778 m), weight 165 lb 3.2 oz (74.934 kg).  No thrush or ulceration. Lungs clear. Regular cardiac rhythm. Port-A-Cath site is without erythema. Tender/firm over the right upper abdomen. No definite hepatomegaly. Extremities without edema.  Lab Results: Lab Results  Component Value Date   WBC 9.7 05/27/2013   HGB 9.3* 05/27/2013   HCT 30.6* 05/27/2013   MCV 79.9 05/27/2013   PLT 333 Occ clump not affecting count 05/27/2013    Chemistry:    Chemistry      Component Value Date/Time   NA 129* 05/14/2013 0837   NA 139 03/14/2013 1023   K 4.3 05/14/2013 0837   K 4.0 03/14/2013 1023   CL 104 03/14/2013 1023   CO2 22 05/14/2013 0837   CO2 22 03/14/2013 1023   BUN 12.4 05/14/2013 0837   BUN 10 03/14/2013 1023   CREATININE 0.8 05/14/2013 0837   CREATININE 0.7 03/14/2013 1023      Component Value Date/Time   CALCIUM 8.9 05/14/2013 0837   CALCIUM 9.1 03/14/2013 1023   ALKPHOS 135 05/14/2013 0837   ALKPHOS 82 03/14/2013 1023   AST 49* 05/14/2013 0837   AST 33 03/14/2013 1023   ALT 22 05/14/2013 0837   ALT 26 03/14/2013 1023   BILITOT 0.92 05/14/2013 0837   BILITOT 0.5 03/14/2013 1023       Studies/Results: US Biopsy  05/05/2013   *RADIOLOGY REPORT*  Clinical Data/Indication: Colon and liver lesions.  ULTRASOUND-GUIDED BIOPSY OF A LEFT LOBE LIVER LESION.  CORE.  Sedation: Versed 3.0 mg, Fentanyl 150 mcg.  Total Moderate Sedation Time: 10 minutes.  Procedure: The procedure, risks, benefits, and alternatives were explained to the patient. Questions regarding the  procedure were encouraged and answered. The patient understands and consents to the procedure.  The epigastrum was prepped with betadine in a sterile fashion, and a sterile drape was applied covering the operative field. A sterile gown and sterile gloves were used for the procedure.  Under sonographic guidance, the 17 gauge needle was inserted into a lesion within the left lobe of the liver.  Three 18 gauge core biopsies were obtained.  Gelfoam slurry was injected into the tract. Final imaging was performed.  Patient tolerated the procedure well without complication.  Vital sign monitoring by nursing staff during the procedure will continue as patient is in the special procedures unit for post procedure observation.  Findings: The images document guide needle placement within the left lobe liver lesion. Post biopsy images demonstrate no hemorrhage.  IMPRESSION: Successful ultrasound-guided core biopsy of a left lobe liver lesion.   Original Report Authenticated By: Jolaine Click, M.D.   Ir Fluoro Guide Cv Line Right  04/28/2013   *RADIOLOGY REPORT*  Clinical Data:  Colon cancer  ULTRASOUND GUIDANCE FOR VASCULAR ACCESS RIGHT INTERNAL JUGULAR SINGLE LUMEN POWER PORT CATHETER INSERTION  Date: 04/28/2013 11:30:00  Radiologist:  M. Ruel Favors, M.D.  Medications:  2 grams ancefadministered within 1 hour of the procedure.4 mg Versed, 2 mcg Fentanyl  Guidance:  Ultrasound and fluoroscopic  Fluoroscopy time:  42 seconds  Sedation time:  35 minutes  Contrast volume:  None.  Complications:  No immediate  PROCEDURE/FINDINGS:  Informed consent was obtained from the patient following explanation of the procedure, risks, benefits and alternatives. The patient understands, agrees and consents for the procedure. All questions were addressed.  A time out was performed.  Maximal barrier sterile technique utilized including caps, mask, sterile gowns, sterile gloves, large sterile drape, hand hygiene, and 2% chlorhexidine scrub.   Under sterile conditions and local anesthesia, right internal jugular micropuncture venous access was performed.  Access was performed with ultrasound.  Images were obtained for documentation. A guide wire was inserted followed by a transitional dilator.  This allowed insertion of a guide wire and catheter into the IVC. Measurements were obtained from the SVC / RA junction back to the right IJ venotomy site.  In the right infraclavicular chest, a subcutaneous pocket was created over the second anterior rib.  This was done under sterile conditions and local anesthesia.  1% lidocaine with epinephrine was utilized for this.  A 2.5 cm incision was made in the skin.  Blunt dissection was performed to create a subcutaneous pocket over the right pectoralis major muscle.  The pocket was flushed with saline vigorously.  There was adequate hemostasis.  The port catheter was assembled and checked for leakage.  The port catheter was secured in the pocket with two retention sutures.  The tubing was tunneled subcutaneously to the right venotomy site and inserted into the SVC/RA junction through a valved peel-away sheath.  Position was confirmed with fluoroscopy. Images were obtained for documentation.  The patient tolerated the procedure well.  No immediate complications.  Incisions were closed in a two layer fashion with 4 - 0 Vicryl suture.  Dermabond was applied to the skin. The port catheter was accessed, blood was aspirated followed by saline and heparin flushes.  Needle was removed.  A dry sterile dressing was applied.  IMPRESSION: Ultrasound and fluoroscopically guided right internal jugular single lumen power port catheter insertion.  Tip in the SVC/RA junction.  Catheter ready for use.   Original Report Authenticated By: Judie Petit. Miles Costain, M.D.   Ir US Guide Vasc Access Right  04/28/2013   *RADIOLOGY REPORT*  Clinical Data:  Colon cancer  ULTRASOUND GUIDANCE FOR VASCULAR ACCESS RIGHT INTERNAL JUGULAR SINGLE LUMEN POWER PORT  CATHETER INSERTION  Date: 04/28/2013 11:30:00  Radiologist:  M. Ruel Favors, M.D.  Medications:  2 grams ancefadministered within 1 hour of the procedure.4 mg Versed, 2 mcg Fentanyl  Guidance:  Ultrasound and fluoroscopic  Fluoroscopy time:  42 seconds  Sedation time:  35 minutes  Contrast volume:  None.  Complications:  No immediate  PROCEDURE/FINDINGS:  Informed consent was obtained from the patient following explanation of the procedure, risks, benefits and alternatives. The patient understands, agrees and consents for the procedure. All questions were addressed.  A time out was performed.  Maximal barrier sterile technique utilized including caps, mask, sterile gowns, sterile gloves, large sterile drape, hand hygiene, and 2% chlorhexidine scrub.  Under sterile conditions and local anesthesia, right internal jugular micropuncture venous access was performed.  Access was performed with ultrasound.  Images were obtained for documentation. A guide wire was inserted followed by a transitional dilator.  This allowed insertion of a guide wire and catheter into the IVC. Measurements were obtained from the SVC / RA junction back to the right IJ venotomy site.  In the right infraclavicular chest, a subcutaneous pocket was created over the second anterior rib.  This was done under sterile conditions  and local anesthesia.  1% lidocaine with epinephrine was utilized for this.  A 2.5 cm incision was made in the skin.  Blunt dissection was performed to create a subcutaneous pocket over the right pectoralis major muscle.  The pocket was flushed with saline vigorously.  There was adequate hemostasis.  The port catheter was assembled and checked for leakage.  The port catheter was secured in the pocket with two retention sutures.  The tubing was tunneled subcutaneously to the right venotomy site and inserted into the SVC/RA junction through a valved peel-away sheath.  Position was confirmed with fluoroscopy. Images were obtained  for documentation.  The patient tolerated the procedure well.  No immediate complications.  Incisions were closed in a two layer fashion with 4 - 0 Vicryl suture.  Dermabond was applied to the skin. The port catheter was accessed, blood was aspirated followed by saline and heparin flushes.  Needle was removed.  A dry sterile dressing was applied.  IMPRESSION: Ultrasound and fluoroscopically guided right internal jugular single lumen power port catheter insertion.  Tip in the SVC/RA junction.  Catheter ready for use.   Original Report Authenticated By: Judie Petit. Miles Costain, M.D.    Medications: I have reviewed the patient's current medications.  Assessment/Plan:  1. Hepatic flexure mass with extensive liver metastases. Status post a colonoscopic biopsy on 04/14/2013 confirming at least high-grade glandular dysplasia.  Status post liver biopsy 05/05/2013 confirming metastatic colorectal adenocarcinoma. Cycle 1 FOLFOX 05/14/2013. 2. Microcytic anemia secondary to #1. He was transfused with PRBCs on 04/30/2013 when the hemoglobin returned at 6.5. He was also given a dose of IV iron. He continues oral iron. Hemoglobin remains stable. 3. Hepatitis C positive. 4. Weight loss. Weight is stable. 5. COPD. 6. Tobacco use. 7. History of alcohol use.  Disposition-he has completed 1 cycle of FOLFOX. Plan to proceed with cycle 2 today as scheduled. He will return for a followup visit and cycle 3 in 2 weeks with plans to add Avastin at that time if otherwise stable.  Plan reviewed with Dr. Truett Perna.  Tony Barajas ANP/GNP-BC

## 2013-05-27 NOTE — Patient Instructions (Signed)
RETURN FOR PUMP DISCONNECT Thursday 05/29/13 AT    [facility] Discharge Instructions for Patients Receiving Chemotherapy  Today you received the following chemotherapy agents: oxaliplatin, leucovorin, 69fu, 104fu pump.  To help prevent nausea and vomiting after your treatment, we encourage you to take your nausea medication.  Take it as often as prescribed.     If you develop nausea and vomiting that is not controlled by your nausea medication, call the clinic. If it is after clinic hours your family physician or the after hours number for the clinic or go to the Emergency Department.   BELOW ARE SYMPTOMS THAT SHOULD BE REPORTED IMMEDIATELY:  *FEVER GREATER THAN 100.5 F  *CHILLS WITH OR WITHOUT FEVER  NAUSEA AND VOMITING THAT IS NOT CONTROLLED WITH YOUR NAUSEA MEDICATION  *UNUSUAL SHORTNESS OF BREATH  *UNUSUAL BRUISING OR BLEEDING  TENDERNESS IN MOUTH AND THROAT WITH OR WITHOUT PRESENCE OF ULCERS  *URINARY PROBLEMS  *BOWEL PROBLEMS  UNUSUAL RASH Items with * indicate a potential emergency and should be followed up as soon as possible.  Feel free to call the clinic you have any questions or concerns. The clinic phone number is [phone].   I have been informed and understand all the instructions given to me. I know to contact the clinic, my physician, or go to the Emergency Department if any problems should occur. I do not have any questions at this time, but understand that I may call the clinic during office hours   should I have any questions or need assistance in obtaining follow up care.    __________________________________________  _____________  __________ Signature of Patient or Authorized Representative            Date                   Time    __________________________________________ Nurse's Signature

## 2013-05-29 ENCOUNTER — Ambulatory Visit (HOSPITAL_BASED_OUTPATIENT_CLINIC_OR_DEPARTMENT_OTHER): Payer: 59

## 2013-05-29 VITALS — BP 110/68 | HR 88 | Temp 98.6°F | Resp 20

## 2013-05-29 DIAGNOSIS — C189 Malignant neoplasm of colon, unspecified: Secondary | ICD-10-CM

## 2013-05-29 DIAGNOSIS — C187 Malignant neoplasm of sigmoid colon: Secondary | ICD-10-CM

## 2013-05-29 DIAGNOSIS — C787 Secondary malignant neoplasm of liver and intrahepatic bile duct: Secondary | ICD-10-CM

## 2013-05-29 DIAGNOSIS — Z452 Encounter for adjustment and management of vascular access device: Secondary | ICD-10-CM

## 2013-05-29 MED ORDER — SODIUM CHLORIDE 0.9 % IJ SOLN
10.0000 mL | INTRAMUSCULAR | Status: DC | PRN
  Administered 2013-05-29: 10 mL
  Filled 2013-05-29: qty 10

## 2013-05-29 MED ORDER — HEPARIN SOD (PORK) LOCK FLUSH 100 UNIT/ML IV SOLN
500.0000 [IU] | Freq: Once | INTRAVENOUS | Status: AC | PRN
  Administered 2013-05-29: 500 [IU]
  Filled 2013-05-29: qty 5

## 2013-05-29 NOTE — Patient Instructions (Addendum)

## 2013-06-08 ENCOUNTER — Other Ambulatory Visit: Payer: Self-pay | Admitting: Oncology

## 2013-06-10 ENCOUNTER — Ambulatory Visit (HOSPITAL_BASED_OUTPATIENT_CLINIC_OR_DEPARTMENT_OTHER): Payer: 59 | Admitting: Lab

## 2013-06-10 ENCOUNTER — Telehealth: Payer: Self-pay

## 2013-06-10 ENCOUNTER — Ambulatory Visit (HOSPITAL_BASED_OUTPATIENT_CLINIC_OR_DEPARTMENT_OTHER): Payer: 59 | Admitting: Oncology

## 2013-06-10 ENCOUNTER — Ambulatory Visit: Payer: 59 | Admitting: Nutrition

## 2013-06-10 ENCOUNTER — Telehealth: Payer: Self-pay | Admitting: *Deleted

## 2013-06-10 ENCOUNTER — Ambulatory Visit (HOSPITAL_BASED_OUTPATIENT_CLINIC_OR_DEPARTMENT_OTHER): Payer: 59

## 2013-06-10 VITALS — BP 94/63 | HR 103 | Temp 96.7°F | Resp 20 | Ht 70.0 in | Wt 159.5 lb

## 2013-06-10 DIAGNOSIS — C189 Malignant neoplasm of colon, unspecified: Secondary | ICD-10-CM

## 2013-06-10 DIAGNOSIS — J449 Chronic obstructive pulmonary disease, unspecified: Secondary | ICD-10-CM

## 2013-06-10 DIAGNOSIS — C787 Secondary malignant neoplasm of liver and intrahepatic bile duct: Secondary | ICD-10-CM

## 2013-06-10 DIAGNOSIS — D509 Iron deficiency anemia, unspecified: Secondary | ICD-10-CM

## 2013-06-10 DIAGNOSIS — C183 Malignant neoplasm of hepatic flexure: Secondary | ICD-10-CM

## 2013-06-10 DIAGNOSIS — F17200 Nicotine dependence, unspecified, uncomplicated: Secondary | ICD-10-CM

## 2013-06-10 DIAGNOSIS — B1920 Unspecified viral hepatitis C without hepatic coma: Secondary | ICD-10-CM

## 2013-06-10 DIAGNOSIS — R634 Abnormal weight loss: Secondary | ICD-10-CM

## 2013-06-10 DIAGNOSIS — Z5111 Encounter for antineoplastic chemotherapy: Secondary | ICD-10-CM

## 2013-06-10 LAB — COMPREHENSIVE METABOLIC PANEL (CC13)
ALT: 17 U/L (ref 0–55)
CO2: 22 mEq/L (ref 22–29)
Calcium: 8.7 mg/dL (ref 8.4–10.4)
Chloride: 106 mEq/L (ref 98–109)
Creatinine: 0.6 mg/dL — ABNORMAL LOW (ref 0.7–1.3)
Glucose: 84 mg/dl (ref 70–140)
Sodium: 136 mEq/L (ref 136–145)
Total Bilirubin: 0.3 mg/dL (ref 0.20–1.20)
Total Protein: 7.7 g/dL (ref 6.4–8.3)

## 2013-06-10 LAB — CBC WITH DIFFERENTIAL/PLATELET
Eosinophils Absolute: 0.1 10*3/uL (ref 0.0–0.5)
HCT: 34.1 % — ABNORMAL LOW (ref 38.4–49.9)
HGB: 10.7 g/dL — ABNORMAL LOW (ref 13.0–17.1)
LYMPH%: 24.2 % (ref 14.0–49.0)
MONO#: 1.3 10*3/uL — ABNORMAL HIGH (ref 0.1–0.9)
NEUT#: 4.6 10*3/uL (ref 1.5–6.5)
NEUT%: 57.9 % (ref 39.0–75.0)
Platelets: 244 10*3/uL (ref 140–400)
WBC: 7.9 10*3/uL (ref 4.0–10.3)
lymph#: 1.9 10*3/uL (ref 0.9–3.3)

## 2013-06-10 MED ORDER — FLUOROURACIL CHEMO INJECTION 2.5 GM/50ML
400.0000 mg/m2 | Freq: Once | INTRAVENOUS | Status: AC
  Administered 2013-06-10: 750 mg via INTRAVENOUS
  Filled 2013-06-10: qty 15

## 2013-06-10 MED ORDER — DEXTROSE 5 % IV SOLN
Freq: Once | INTRAVENOUS | Status: AC
  Administered 2013-06-10: 11:00:00 via INTRAVENOUS

## 2013-06-10 MED ORDER — DEXAMETHASONE SODIUM PHOSPHATE 10 MG/ML IJ SOLN
10.0000 mg | Freq: Once | INTRAMUSCULAR | Status: AC
  Administered 2013-06-10: 10 mg via INTRAVENOUS

## 2013-06-10 MED ORDER — DEXAMETHASONE SODIUM PHOSPHATE 10 MG/ML IJ SOLN
INTRAMUSCULAR | Status: AC
  Filled 2013-06-10: qty 1

## 2013-06-10 MED ORDER — LEUCOVORIN CALCIUM INJECTION 350 MG
400.0000 mg/m2 | Freq: Once | INTRAVENOUS | Status: AC
  Administered 2013-06-10: 772 mg via INTRAVENOUS
  Filled 2013-06-10: qty 38.6

## 2013-06-10 MED ORDER — SODIUM CHLORIDE 0.9 % IV SOLN
2400.0000 mg/m2 | INTRAVENOUS | Status: DC
  Administered 2013-06-10: 4650 mg via INTRAVENOUS
  Filled 2013-06-10: qty 93

## 2013-06-10 MED ORDER — ONDANSETRON 8 MG/50ML IVPB (CHCC)
8.0000 mg | Freq: Once | INTRAVENOUS | Status: AC
  Administered 2013-06-10: 8 mg via INTRAVENOUS

## 2013-06-10 MED ORDER — ONDANSETRON 8 MG/NS 50 ML IVPB
INTRAVENOUS | Status: AC
  Filled 2013-06-10: qty 8

## 2013-06-10 MED ORDER — OXALIPLATIN CHEMO INJECTION 100 MG/20ML
85.0000 mg/m2 | Freq: Once | INTRAVENOUS | Status: AC
  Administered 2013-06-10: 165 mg via INTRAVENOUS
  Filled 2013-06-10: qty 33

## 2013-06-10 NOTE — Telephone Encounter (Signed)
Per staff message and POF I have scheduled appts.  JMW  

## 2013-06-10 NOTE — Progress Notes (Signed)
Patient reports he has increased his oral intake the past 4 days and has had a great appetite.  He continues to rely on convenience foods.  He feels his nausea is under control.  Weight decreased to 159.5 pounds on September 9 from 165.2 pounds August 26.  Nutrition diagnosis: Unintentional weight loss continues.  Intervention: Continue high-calorie, high-protein foods with Ensure Plus 3 times a day between meals. Continue to utilize convenience foods as needed during treatments. Nausea medications as prescribed./As needed.   Provided patient with one complementary case of Ensure Plus.  Monitoring, evaluation, goals: Patient will minimize weight loss throughout treatment.  He will tolerate Ensure Plus 3 times a day between meals.  Next visit: Tuesday, September 23, during chemotherapy.

## 2013-06-10 NOTE — Patient Instructions (Addendum)
[facility] Discharge Instructions for Patients Receiving Chemotherapy  Today you received the following chemotherapy agents: Oxaliplatin, 5-FU, Leucovorin   To help prevent nausea and vomiting after your treatment, we encourage you to take your nausea medication as directed by your physician.   If you develop nausea and vomiting that is not controlled by your nausea medication, call the clinic.   BELOW ARE SYMPTOMS THAT SHOULD BE REPORTED IMMEDIATELY:  *FEVER GREATER THAN 100.5 F  *CHILLS WITH OR WITHOUT FEVER  NAUSEA AND VOMITING THAT IS NOT CONTROLLED WITH YOUR NAUSEA MEDICATION  *UNUSUAL SHORTNESS OF BREATH  *UNUSUAL BRUISING OR BLEEDING  TENDERNESS IN MOUTH AND THROAT WITH OR WITHOUT PRESENCE OF ULCERS  *URINARY PROBLEMS  *BOWEL PROBLEMS  UNUSUAL RASH Items with * indicate a potential emergency and should be followed up as soon as possible.  Feel free to call the clinic you have any questions or concerns. The clinic phone number is [phone].

## 2013-06-10 NOTE — Telephone Encounter (Signed)
gv and printed appt sched and avs for pt for SEpt and OCT....MW added tx.

## 2013-06-10 NOTE — Progress Notes (Signed)
   [facility]    OFFICE PROGRESS NOTE   INTERVAL HISTORY:   He returns as scheduled. He completed a second cycle of FOLFOX on 05/27/2013. He tolerated chemotherapy well. No nausea/vomiting, mouth sores, or diarrhea following chemotherapy. Cold sensitivity lasted for several days. No neuropathy symptoms today. He has noted resolution of the dark stools over the past several days. Mr. Stracener reports mild pain in the right abdomen  Objective:  Vital signs in last 24 hours:  Blood pressure 94/63, pulse 103, temperature 96.7 F (35.9 C), temperature source Oral, resp. rate 20, height 5\' 10"  (1.778 m), weight 159 lb 8 oz (72.349 kg).    HEENT: No thrush or ulcer Resp: Lungs clear bilaterally Cardio: Regular rate and rhythm GI: No hepatomegaly, no mass, mild tenderness in the right upper lateral abdomen Vascular: No leg edema  Portacath/PICC-without erythema  Lab Results:  Lab Results  Component Value Date   WBC 7.9 06/10/2013   HGB 10.7* 06/10/2013   HCT 34.1* 06/10/2013   MCV 81.8 06/10/2013   PLT 244 06/10/2013      Medications: I have reviewed the patient's current medications.  Assessment/Plan: 1. Hepatic flexure mass with extensive liver metastases. Status post a colonoscopic biopsy on 04/14/2013 confirming at least high-grade glandular dysplasia.  Status post liver biopsy 05/05/2013 confirming metastatic colorectal adenocarcinoma.  Cycle 1 FOLFOX 05/14/2013. 2. Microcytic anemia secondary to #1. He was transfused with PRBCs on 04/30/2013 when the hemoglobin returned at 6.5. He was also given a dose of IV iron. He continues oral iron. Hemoglobin has improved 3. Hepatitis C positive. 4. Weight loss. He has lost weight over the past few weeks, but he reports a much improved appetite 5. COPD. 6. Tobacco use. 7. History of alcohol use.  Disposition:  He has completed 2 cycles of FOLFOX. Mr. Schar has tolerated the chemotherapy well. The hemoglobin is  higher and it appears the GI bleeding has resolved. The plan is to proceed with cycle 3 of FOLFOX today. We will consider adding Avastin if the hemoglobin remains stable, but he may be at increased risk for bowel perforation/bleeding with the ulcerated tumor and evidence of direct hepatic invasion.  Mr. Imran will return for an office visit and chemotherapy in 2 weeks. He will contact us for increased abdominal pain   Thornton Papas, MD  06/10/2013  6:35 PM

## 2013-06-12 ENCOUNTER — Ambulatory Visit (HOSPITAL_BASED_OUTPATIENT_CLINIC_OR_DEPARTMENT_OTHER): Payer: 59

## 2013-06-12 VITALS — BP 101/62 | HR 105 | Temp 98.3°F

## 2013-06-12 DIAGNOSIS — C189 Malignant neoplasm of colon, unspecified: Secondary | ICD-10-CM

## 2013-06-12 DIAGNOSIS — C787 Secondary malignant neoplasm of liver and intrahepatic bile duct: Secondary | ICD-10-CM

## 2013-06-12 MED ORDER — SODIUM CHLORIDE 0.9 % IJ SOLN
10.0000 mL | INTRAMUSCULAR | Status: DC | PRN
  Administered 2013-06-12: 10 mL
  Filled 2013-06-12: qty 10

## 2013-06-12 MED ORDER — HEPARIN SOD (PORK) LOCK FLUSH 100 UNIT/ML IV SOLN
500.0000 [IU] | Freq: Once | INTRAVENOUS | Status: AC | PRN
  Administered 2013-06-12: 500 [IU]
  Filled 2013-06-12: qty 5

## 2013-06-22 ENCOUNTER — Other Ambulatory Visit: Payer: Self-pay | Admitting: Oncology

## 2013-06-24 ENCOUNTER — Encounter: Payer: Self-pay | Admitting: Family

## 2013-06-24 ENCOUNTER — Ambulatory Visit: Payer: 59 | Admitting: Nutrition

## 2013-06-24 ENCOUNTER — Ambulatory Visit (HOSPITAL_BASED_OUTPATIENT_CLINIC_OR_DEPARTMENT_OTHER): Payer: 59

## 2013-06-24 ENCOUNTER — Other Ambulatory Visit (HOSPITAL_BASED_OUTPATIENT_CLINIC_OR_DEPARTMENT_OTHER): Payer: 59

## 2013-06-24 ENCOUNTER — Other Ambulatory Visit: Payer: Self-pay | Admitting: Oncology

## 2013-06-24 ENCOUNTER — Ambulatory Visit (HOSPITAL_BASED_OUTPATIENT_CLINIC_OR_DEPARTMENT_OTHER): Payer: 59 | Admitting: Family

## 2013-06-24 VITALS — BP 105/76 | HR 110 | Temp 97.0°F | Resp 18 | Ht 70.0 in | Wt 160.6 lb

## 2013-06-24 DIAGNOSIS — C183 Malignant neoplasm of hepatic flexure: Secondary | ICD-10-CM

## 2013-06-24 DIAGNOSIS — R634 Abnormal weight loss: Secondary | ICD-10-CM

## 2013-06-24 DIAGNOSIS — Z5111 Encounter for antineoplastic chemotherapy: Secondary | ICD-10-CM

## 2013-06-24 DIAGNOSIS — C189 Malignant neoplasm of colon, unspecified: Secondary | ICD-10-CM

## 2013-06-24 DIAGNOSIS — C787 Secondary malignant neoplasm of liver and intrahepatic bile duct: Secondary | ICD-10-CM

## 2013-06-24 DIAGNOSIS — B1920 Unspecified viral hepatitis C without hepatic coma: Secondary | ICD-10-CM

## 2013-06-24 DIAGNOSIS — G4700 Insomnia, unspecified: Secondary | ICD-10-CM

## 2013-06-24 DIAGNOSIS — D509 Iron deficiency anemia, unspecified: Secondary | ICD-10-CM

## 2013-06-24 DIAGNOSIS — J449 Chronic obstructive pulmonary disease, unspecified: Secondary | ICD-10-CM

## 2013-06-24 LAB — CBC WITH DIFFERENTIAL/PLATELET
BASO%: 0.2 % (ref 0.0–2.0)
Basophils Absolute: 0 10*3/uL (ref 0.0–0.1)
HCT: 35.1 % — ABNORMAL LOW (ref 38.4–49.9)
HGB: 11.2 g/dL — ABNORMAL LOW (ref 13.0–17.1)
LYMPH%: 20.4 % (ref 14.0–49.0)
MCH: 27.7 pg (ref 27.2–33.4)
MCHC: 31.9 g/dL — ABNORMAL LOW (ref 32.0–36.0)
MONO#: 1.4 10*3/uL — ABNORMAL HIGH (ref 0.1–0.9)
NEUT%: 63 % (ref 39.0–75.0)
Platelets: 191 10*3/uL (ref 140–400)
WBC: 10.1 10*3/uL (ref 4.0–10.3)

## 2013-06-24 LAB — COMPREHENSIVE METABOLIC PANEL (CC13)
AST: 32 U/L (ref 5–34)
Albumin: 2.6 g/dL — ABNORMAL LOW (ref 3.5–5.0)
BUN: 12.2 mg/dL (ref 7.0–26.0)
Calcium: 8.7 mg/dL (ref 8.4–10.4)
Chloride: 105 mEq/L (ref 98–109)
Creatinine: 0.7 mg/dL (ref 0.7–1.3)
Glucose: 99 mg/dl (ref 70–140)
Potassium: 3.9 mEq/L (ref 3.5–5.1)

## 2013-06-24 MED ORDER — ZOLPIDEM TARTRATE 5 MG PO TABS: 5.0000 mg | ORAL_TABLET | Freq: Every evening | ORAL | Status: DC | PRN

## 2013-06-24 MED ORDER — LEUCOVORIN CALCIUM INJECTION 350 MG
400.0000 mg/m2 | Freq: Once | INTRAVENOUS | Status: AC
  Administered 2013-06-24: 772 mg via INTRAVENOUS
  Filled 2013-06-24: qty 38.6

## 2013-06-24 MED ORDER — ONDANSETRON 8 MG/NS 50 ML IVPB
INTRAVENOUS | Status: AC
  Filled 2013-06-24: qty 8

## 2013-06-24 MED ORDER — FLUOROURACIL CHEMO INJECTION 5 GM/100ML
2400.0000 mg/m2 | INTRAVENOUS | Status: DC
  Administered 2013-06-24: 4650 mg via INTRAVENOUS
  Filled 2013-06-24: qty 93

## 2013-06-24 MED ORDER — FLUOROURACIL CHEMO INJECTION 2.5 GM/50ML
400.0000 mg/m2 | Freq: Once | INTRAVENOUS | Status: AC
  Administered 2013-06-24: 750 mg via INTRAVENOUS
  Filled 2013-06-24: qty 15

## 2013-06-24 MED ORDER — DEXAMETHASONE SODIUM PHOSPHATE 10 MG/ML IJ SOLN
10.0000 mg | Freq: Once | INTRAMUSCULAR | Status: AC
  Administered 2013-06-24: 10 mg via INTRAVENOUS

## 2013-06-24 MED ORDER — DEXAMETHASONE SODIUM PHOSPHATE 10 MG/ML IJ SOLN
INTRAMUSCULAR | Status: AC
  Filled 2013-06-24: qty 1

## 2013-06-24 MED ORDER — DEXTROSE 5 % IV SOLN
Freq: Once | INTRAVENOUS | Status: AC
  Administered 2013-06-24: 12:00:00 via INTRAVENOUS

## 2013-06-24 MED ORDER — OXALIPLATIN CHEMO INJECTION 100 MG/20ML
85.0000 mg/m2 | Freq: Once | INTRAVENOUS | Status: AC
  Administered 2013-06-24: 165 mg via INTRAVENOUS
  Filled 2013-06-24: qty 33

## 2013-06-24 MED ORDER — ONDANSETRON 8 MG/50ML IVPB (CHCC)
8.0000 mg | Freq: Once | INTRAVENOUS | Status: AC
  Administered 2013-06-24: 8 mg via INTRAVENOUS

## 2013-06-24 NOTE — Progress Notes (Signed)
Patient is tolerating chemotherapy well.  His appetite has improved.  His nausea is controlled.  He did experience gas after he consumed a meal with cabbage.  He continues to cook and freeze soups and stews so that he has an easy meal available to him.  Weight up slightly to 160.6 pounds on September 23 from 159.5 pounds September 9.  Nutrition diagnosis: Unintentional weight loss improved.  Intervention: Patient is to continue high-calorie, high-protein foods with Ensure Plus 3 times a day between meals.  He will continue to plan meals as needed.  Nausea medication to be taken as needed.  Monitoring, evaluation, goals: Patient will continue to tolerate oral diet with Ensure Plus to promote weight gain/weight stabilization.  Next visit: Tuesday, October 14, during chemotherapy.

## 2013-06-24 NOTE — Patient Instructions (Addendum)
Please contact us at [phone] if you have any questions or concerns.  Please continue to do well and enjoy life!!!  Get plenty of rest, drink plenty of water, exercise daily (walking), eat a balanced diet.   Results for orders placed in visit on 06/24/13 (from the past 24 hour(s))  CBC WITH DIFFERENTIAL     Status: Abnormal   Collection Time    06/24/13  9:42 AM      Result Value Range   WBC 10.1  4.0 - 10.3 10e3/uL   NEUT# 6.4  1.5 - 6.5 10e3/uL   HGB 11.2 (*) 13.0 - 17.1 g/dL   HCT 11.9 (*) 14.7 - 82.9 %   Platelets 191  140 - 400 10e3/uL   MCV 86.7  79.3 - 98.0 fL   MCH 27.7  27.2 - 33.4 pg   MCHC 31.9 (*) 32.0 - 36.0 g/dL   RBC 5.62 (*) 1.30 - 8.65 10e6/uL   RDW 26.7 (*) 11.0 - 14.6 %   lymph# 2.1  0.9 - 3.3 10e3/uL   MONO# 1.4 (*) 0.1 - 0.9 10e3/uL   Eosinophils Absolute 0.2  0.0 - 0.5 10e3/uL   Basophils Absolute 0.0  0.0 - 0.1 10e3/uL   NEUT% 63.0  39.0 - 75.0 %   LYMPH% 20.4  14.0 - 49.0 %   MONO% 14.0  0.0 - 14.0 %   EOS% 2.4  0.0 - 7.0 %   BASO% 0.2  0.0 - 2.0 %   nRBC 0  0 - 0 %   Narrative:    This order was split into 3orders: (CBC & Diff), (Comprehensive Metabolic Panel  (STAT)), ( Collected by RN )Performed At:  [facility]               [address]               [place], [place] [address]

## 2013-06-24 NOTE — Patient Instructions (Addendum)
[facility] Discharge Instructions for Patients Receiving Chemotherapy  Today you received the following chemotherapy agents FOLFOX.   To help prevent nausea and vomiting after your treatment, we encourage you to take your nausea medication as directed.    If you develop nausea and vomiting that is not controlled by your nausea medication, call the clinic.   BELOW ARE SYMPTOMS THAT SHOULD BE REPORTED IMMEDIATELY:  *FEVER GREATER THAN 100.5 F  *CHILLS WITH OR WITHOUT FEVER  NAUSEA AND VOMITING THAT IS NOT CONTROLLED WITH YOUR NAUSEA MEDICATION  *UNUSUAL SHORTNESS OF BREATH  *UNUSUAL BRUISING OR BLEEDING  TENDERNESS IN MOUTH AND THROAT WITH OR WITHOUT PRESENCE OF ULCERS  *URINARY PROBLEMS  *BOWEL PROBLEMS  UNUSUAL RASH Items with * indicate a potential emergency and should be followed up as soon as possible.  Feel free to call the clinic you have any questions or concerns. The clinic phone number is [phone].    
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
## 2013-06-26 ENCOUNTER — Ambulatory Visit (HOSPITAL_BASED_OUTPATIENT_CLINIC_OR_DEPARTMENT_OTHER): Payer: 59

## 2013-06-26 VITALS — BP 120/63 | HR 111 | Temp 98.4°F | Resp 18

## 2013-06-26 DIAGNOSIS — C787 Secondary malignant neoplasm of liver and intrahepatic bile duct: Secondary | ICD-10-CM

## 2013-06-26 DIAGNOSIS — C189 Malignant neoplasm of colon, unspecified: Secondary | ICD-10-CM

## 2013-06-26 MED ORDER — SODIUM CHLORIDE 0.9 % IJ SOLN
10.0000 mL | INTRAMUSCULAR | Status: DC | PRN
  Administered 2013-06-26: 10 mL
  Filled 2013-06-26: qty 10

## 2013-06-26 MED ORDER — HEPARIN SOD (PORK) LOCK FLUSH 100 UNIT/ML IV SOLN
500.0000 [IU] | Freq: Once | INTRAVENOUS | Status: AC | PRN
  Administered 2013-06-26: 500 [IU]
  Filled 2013-06-26: qty 5

## 2013-07-15 ENCOUNTER — Ambulatory Visit (HOSPITAL_BASED_OUTPATIENT_CLINIC_OR_DEPARTMENT_OTHER): Payer: 59

## 2013-07-15 ENCOUNTER — Encounter (INDEPENDENT_AMBULATORY_CARE_PROVIDER_SITE_OTHER): Payer: Self-pay

## 2013-07-15 ENCOUNTER — Other Ambulatory Visit (HOSPITAL_BASED_OUTPATIENT_CLINIC_OR_DEPARTMENT_OTHER): Payer: 59 | Admitting: Lab

## 2013-07-15 ENCOUNTER — Ambulatory Visit (HOSPITAL_BASED_OUTPATIENT_CLINIC_OR_DEPARTMENT_OTHER): Payer: 59 | Admitting: Nurse Practitioner

## 2013-07-15 ENCOUNTER — Other Ambulatory Visit: Payer: Self-pay | Admitting: Oncology

## 2013-07-15 ENCOUNTER — Ambulatory Visit: Payer: 59 | Admitting: Nutrition

## 2013-07-15 VITALS — BP 97/65 | HR 120 | Temp 97.0°F | Resp 20 | Ht 70.0 in | Wt 156.8 lb

## 2013-07-15 DIAGNOSIS — C189 Malignant neoplasm of colon, unspecified: Secondary | ICD-10-CM

## 2013-07-15 DIAGNOSIS — D509 Iron deficiency anemia, unspecified: Secondary | ICD-10-CM

## 2013-07-15 DIAGNOSIS — C183 Malignant neoplasm of hepatic flexure: Secondary | ICD-10-CM

## 2013-07-15 DIAGNOSIS — F17200 Nicotine dependence, unspecified, uncomplicated: Secondary | ICD-10-CM

## 2013-07-15 DIAGNOSIS — B1920 Unspecified viral hepatitis C without hepatic coma: Secondary | ICD-10-CM

## 2013-07-15 DIAGNOSIS — C787 Secondary malignant neoplasm of liver and intrahepatic bile duct: Secondary | ICD-10-CM

## 2013-07-15 DIAGNOSIS — Z5111 Encounter for antineoplastic chemotherapy: Secondary | ICD-10-CM

## 2013-07-15 DIAGNOSIS — J449 Chronic obstructive pulmonary disease, unspecified: Secondary | ICD-10-CM

## 2013-07-15 DIAGNOSIS — R634 Abnormal weight loss: Secondary | ICD-10-CM

## 2013-07-15 LAB — COMPREHENSIVE METABOLIC PANEL (CC13)
AST: 57 U/L — ABNORMAL HIGH (ref 5–34)
Albumin: 2.4 g/dL — ABNORMAL LOW (ref 3.5–5.0)
Anion Gap: 9 mEq/L (ref 3–11)
BUN: 9.2 mg/dL (ref 7.0–26.0)
CO2: 23 mEq/L (ref 22–29)
Calcium: 8.4 mg/dL (ref 8.4–10.4)
Chloride: 103 mEq/L (ref 98–109)
Creatinine: 0.7 mg/dL (ref 0.7–1.3)
Glucose: 107 mg/dl (ref 70–140)
Potassium: 3.8 mEq/L (ref 3.5–5.1)
Total Bilirubin: 0.5 mg/dL (ref 0.20–1.20)
Total Protein: 7.9 g/dL (ref 6.4–8.3)

## 2013-07-15 LAB — CBC WITH DIFFERENTIAL/PLATELET
BASO%: 0.2 % (ref 0.0–2.0)
Basophils Absolute: 0 10*3/uL (ref 0.0–0.1)
Eosinophils Absolute: 0.1 10*3/uL (ref 0.0–0.5)
HCT: 36.8 % — ABNORMAL LOW (ref 38.4–49.9)
LYMPH%: 33 % (ref 14.0–49.0)
MCV: 89.8 fL (ref 79.3–98.0)
MONO%: 22 % — ABNORMAL HIGH (ref 0.0–14.0)
NEUT#: 2.3 10*3/uL (ref 1.5–6.5)
NEUT%: 42.8 % (ref 39.0–75.0)
Platelets: 265 10*3/uL (ref 140–400)
RBC: 4.1 10*6/uL — ABNORMAL LOW (ref 4.20–5.82)
WBC: 5.5 10*3/uL (ref 4.0–10.3)
nRBC: 0 % (ref 0–0)

## 2013-07-15 MED ORDER — OXALIPLATIN CHEMO INJECTION 100 MG/20ML
85.0000 mg/m2 | Freq: Once | INTRAVENOUS | Status: AC
  Administered 2013-07-15: 165 mg via INTRAVENOUS
  Filled 2013-07-15: qty 33

## 2013-07-15 MED ORDER — SODIUM CHLORIDE 0.9 % IV SOLN
2400.0000 mg/m2 | INTRAVENOUS | Status: DC
  Administered 2013-07-15: 4650 mg via INTRAVENOUS
  Filled 2013-07-15: qty 93

## 2013-07-15 MED ORDER — FLUOROURACIL CHEMO INJECTION 2.5 GM/50ML
400.0000 mg/m2 | Freq: Once | INTRAVENOUS | Status: AC
  Administered 2013-07-15: 750 mg via INTRAVENOUS
  Filled 2013-07-15: qty 15

## 2013-07-15 MED ORDER — ONDANSETRON 8 MG/NS 50 ML IVPB
INTRAVENOUS | Status: AC
  Filled 2013-07-15: qty 8

## 2013-07-15 MED ORDER — DEXAMETHASONE SODIUM PHOSPHATE 10 MG/ML IJ SOLN
INTRAMUSCULAR | Status: AC
  Filled 2013-07-15: qty 1

## 2013-07-15 MED ORDER — DEXTROSE 5 % IV SOLN
Freq: Once | INTRAVENOUS | Status: AC
  Administered 2013-07-15: 11:00:00 via INTRAVENOUS

## 2013-07-15 MED ORDER — DEXAMETHASONE SODIUM PHOSPHATE 10 MG/ML IJ SOLN
10.0000 mg | Freq: Once | INTRAMUSCULAR | Status: AC
  Administered 2013-07-15: 10 mg via INTRAVENOUS

## 2013-07-15 MED ORDER — LEUCOVORIN CALCIUM INJECTION 350 MG
400.0000 mg/m2 | Freq: Once | INTRAVENOUS | Status: AC
  Administered 2013-07-15: 772 mg via INTRAVENOUS
  Filled 2013-07-15: qty 38.6

## 2013-07-15 MED ORDER — ONDANSETRON 8 MG/50ML IVPB (CHCC)
8.0000 mg | Freq: Once | INTRAVENOUS | Status: AC
  Administered 2013-07-15: 8 mg via INTRAVENOUS

## 2013-07-15 NOTE — Patient Instructions (Signed)
[facility] Discharge Instructions for Patients Receiving Chemotherapy  Today you received the following chemotherapy agents: Oxaliplatin, Leucovorin and Fluorouracil. To help prevent nausea and vomiting after your treatment, we encourage you to take your nausea medication, Compazine. Take it every six hours as needed for nausea.   If you develop nausea and vomiting that is not controlled by your nausea medication, call the clinic.   BELOW ARE SYMPTOMS THAT SHOULD BE REPORTED IMMEDIATELY:  *FEVER GREATER THAN 100.5 F  *CHILLS WITH OR WITHOUT FEVER  NAUSEA AND VOMITING THAT IS NOT CONTROLLED WITH YOUR NAUSEA MEDICATION  *UNUSUAL SHORTNESS OF BREATH  *UNUSUAL BRUISING OR BLEEDING  TENDERNESS IN MOUTH AND THROAT WITH OR WITHOUT PRESENCE OF ULCERS  *URINARY PROBLEMS  *BOWEL PROBLEMS  UNUSUAL RASH Items with * indicate a potential emergency and should be followed up as soon as possible.  Feel free to call the clinic should you have any questions or concerns. The clinic phone number is [phone].

## 2013-07-15 NOTE — Telephone Encounter (Signed)
Per staff message and POF I have scheduled appts.  JMW  

## 2013-07-15 NOTE — Progress Notes (Signed)
OFFICE PROGRESS NOTE  Interval history:  [name] is a 57 year old man with metastatic colon cancer. He completed cycle 4 FOLFOX on 06/24/2013. He is seen today for scheduled followup.  He overall is feeling well. He has mild intermittent nausea. No mouth sores. He has occasional loose stools. He notes mild persistent cold sensitivity. No numbness or tingling in the absence of cold exposure. He denies any bleeding. His pain is "fine". He denies shortness of breath and cough. No fever. No skin rash.   Objective: Blood pressure 97/65, pulse 120, temperature 97 F (36.1 C), temperature source Oral, resp. rate 20, height 5\' 10"  (1.778 m), weight 156 lb 12.8 oz (71.124 kg).  Oropharynx is without thrush or ulceration. Lungs are clear. Regular cardiac rhythm. Port-A-Cath site is without erythema. Abdomen is soft and nontender. No hepatomegaly. Extremities without edema. Calves soft and nontender. Palms without erythema. No skin rash. Vibratory sense intact over the fingertips per tuning fork exam. Alert and oriented.  Lab Results: Lab Results  Component Value Date   WBC 5.5 07/15/2013   HGB 12.1* 07/15/2013   HCT 36.8* 07/15/2013   MCV 89.8 07/15/2013   PLT 265 07/15/2013    Chemistry:    Chemistry      Component Value Date/Time   NA 135* 06/24/2013 0942   NA 139 03/14/2013 1023   K 3.9 06/24/2013 0942   K 4.0 03/14/2013 1023   CL 104 03/14/2013 1023   CO2 20* 06/24/2013 0942   CO2 22 03/14/2013 1023   BUN 12.2 06/24/2013 0942   BUN 10 03/14/2013 1023   CREATININE 0.7 06/24/2013 0942   CREATININE 0.7 03/14/2013 1023      Component Value Date/Time   CALCIUM 8.7 06/24/2013 0942   CALCIUM 9.1 03/14/2013 1023   ALKPHOS 123 06/24/2013 0942   ALKPHOS 82 03/14/2013 1023   AST 32 06/24/2013 0942   AST 33 03/14/2013 1023   ALT 15 06/24/2013 0942   ALT 26 03/14/2013 1023   BILITOT 0.54 06/24/2013 0942   BILITOT 0.5 03/14/2013 1023       Studies/Results: No results found.  Medications: I have  reviewed the patient's current medications.  Assessment/Plan:  1. Hepatic flexure mass with extensive liver metastases. Status post a colonoscopic biopsy on 04/14/2013 confirming at least high-grade glandular dysplasia.  Status post liver biopsy 05/05/2013 confirming metastatic colorectal adenocarcinoma.  Cycle 1 FOLFOX 05/14/2013. Cycle 2 FOLFOX 05/27/2013. Cycle 3 FOLFOX 06/10/2013. Cycle 4 FOLFOX 06/24/2013. 2. Microcytic anemia secondary to #1. He was transfused with PRBCs on 04/30/2013 when the hemoglobin returned at 6.5. He was also given a dose of IV iron. He continues oral iron. Hemoglobin continues to improve. 3. Hepatitis C positive. 4. Weight loss.  5. COPD. 6. Tobacco use. 7. History of alcohol use.  Disposition-Tony Barajas appears stable. He has completed 4 cycles of FOLFOX. Plan to proceed with cycle 5 today as scheduled. We referred him for restaging CT scans on 07/25/2013. We will see him in followup on 07/29/2013. He will contact the office in the interim with any problems.  Plan reviewed with Dr. Truett Perna.  Lonna Cobb ANP/GNP-BC

## 2013-07-15 NOTE — Progress Notes (Signed)
Patient reports he was on a beach trip last week where he had increased activity and decreased oral intake.  He reports mild intermittent nausea and diarrhea.  He denies chewing and swallowing problems.  He reports running out of his nausea medications while he was away at the beach.  Weight declined to 156.8 pounds October 14 from 160.6 pounds September 23.  Nutrition diagnosis: Unintentional weight loss continues.  Intervention: Patient educated to resume Ensure Plus 3 times a day between meals.  He will resume nausea medication as needed.  Additional samples provided.  Questions were answered.  Teach back method used.  Monitoring, evaluation, goals: Patient experienced 4 pound weight loss over the past 3 weeks.  He will work to increase oral intake, including Ensure Plus 3 times a day.  Next visit: Tuesday, October 28, during chemotherapy.

## 2013-07-15 NOTE — Telephone Encounter (Signed)
gv and printed appt sched and avs for pt for OCT and NOV...emailed MW to add tx..  °

## 2013-07-17 ENCOUNTER — Encounter: Payer: Self-pay | Admitting: Oncology

## 2013-07-17 ENCOUNTER — Ambulatory Visit (HOSPITAL_BASED_OUTPATIENT_CLINIC_OR_DEPARTMENT_OTHER): Payer: 59

## 2013-07-17 VITALS — BP 97/61 | HR 108 | Temp 97.6°F | Resp 18

## 2013-07-17 DIAGNOSIS — C183 Malignant neoplasm of hepatic flexure: Secondary | ICD-10-CM

## 2013-07-17 DIAGNOSIS — C787 Secondary malignant neoplasm of liver and intrahepatic bile duct: Secondary | ICD-10-CM

## 2013-07-17 DIAGNOSIS — C189 Malignant neoplasm of colon, unspecified: Secondary | ICD-10-CM

## 2013-07-17 MED ORDER — HEPARIN SOD (PORK) LOCK FLUSH 100 UNIT/ML IV SOLN
500.0000 [IU] | Freq: Once | INTRAVENOUS | Status: AC | PRN
  Administered 2013-07-17: 500 [IU]
  Filled 2013-07-17: qty 5

## 2013-07-17 MED ORDER — SODIUM CHLORIDE 0.9 % IJ SOLN
10.0000 mL | INTRAMUSCULAR | Status: DC | PRN
  Administered 2013-07-17: 10 mL
  Filled 2013-07-17: qty 10

## 2013-07-17 NOTE — Progress Notes (Signed)
Put [employer] disability form on nurse's desk.

## 2013-07-18 ENCOUNTER — Encounter: Payer: Self-pay | Admitting: Oncology

## 2013-07-18 NOTE — Progress Notes (Signed)
Faxed disability form to [facility] @ [phone].

## 2013-07-25 ENCOUNTER — Encounter (HOSPITAL_COMMUNITY): Payer: Self-pay

## 2013-07-25 ENCOUNTER — Ambulatory Visit (HOSPITAL_COMMUNITY)
Admission: RE | Admit: 2013-07-25 | Discharge: 2013-07-25 | Disposition: A | Discharge status: Home / Self Care | Payer: 59 | Source: Ambulatory Visit | Attending: Nurse Practitioner | Admitting: Nurse Practitioner

## 2013-07-25 DIAGNOSIS — C787 Secondary malignant neoplasm of liver and intrahepatic bile duct: Secondary | ICD-10-CM | POA: Insufficient documentation

## 2013-07-25 DIAGNOSIS — K55059 Acute (reversible) ischemia of intestine, part and extent unspecified: Secondary | ICD-10-CM | POA: Insufficient documentation

## 2013-07-25 DIAGNOSIS — C189 Malignant neoplasm of colon, unspecified: Secondary | ICD-10-CM

## 2013-07-25 DIAGNOSIS — R599 Enlarged lymph nodes, unspecified: Secondary | ICD-10-CM | POA: Insufficient documentation

## 2013-07-25 MED ORDER — IOHEXOL 300 MG/ML  SOLN
100.0000 mL | Freq: Once | INTRAMUSCULAR | Status: AC | PRN
  Administered 2013-07-25: 100 mL via INTRAVENOUS

## 2013-07-27 ENCOUNTER — Other Ambulatory Visit: Payer: Self-pay | Admitting: Oncology

## 2013-07-29 ENCOUNTER — Telehealth: Payer: Self-pay | Admitting: *Deleted

## 2013-07-29 ENCOUNTER — Other Ambulatory Visit: Payer: Self-pay | Admitting: *Deleted

## 2013-07-29 ENCOUNTER — Ambulatory Visit: Payer: 59 | Admitting: Nutrition

## 2013-07-29 ENCOUNTER — Ambulatory Visit (HOSPITAL_BASED_OUTPATIENT_CLINIC_OR_DEPARTMENT_OTHER): Payer: 59 | Admitting: Nurse Practitioner

## 2013-07-29 ENCOUNTER — Ambulatory Visit (HOSPITAL_BASED_OUTPATIENT_CLINIC_OR_DEPARTMENT_OTHER): Payer: 59

## 2013-07-29 ENCOUNTER — Other Ambulatory Visit (HOSPITAL_BASED_OUTPATIENT_CLINIC_OR_DEPARTMENT_OTHER): Payer: 59 | Admitting: Lab

## 2013-07-29 VITALS — BP 95/63 | HR 96 | Temp 96.8°F | Resp 18 | Ht 70.0 in | Wt 157.9 lb

## 2013-07-29 DIAGNOSIS — J449 Chronic obstructive pulmonary disease, unspecified: Secondary | ICD-10-CM

## 2013-07-29 DIAGNOSIS — D509 Iron deficiency anemia, unspecified: Secondary | ICD-10-CM

## 2013-07-29 DIAGNOSIS — C189 Malignant neoplasm of colon, unspecified: Secondary | ICD-10-CM

## 2013-07-29 DIAGNOSIS — C183 Malignant neoplasm of hepatic flexure: Secondary | ICD-10-CM

## 2013-07-29 DIAGNOSIS — C787 Secondary malignant neoplasm of liver and intrahepatic bile duct: Secondary | ICD-10-CM

## 2013-07-29 DIAGNOSIS — Z5111 Encounter for antineoplastic chemotherapy: Secondary | ICD-10-CM

## 2013-07-29 DIAGNOSIS — F17200 Nicotine dependence, unspecified, uncomplicated: Secondary | ICD-10-CM

## 2013-07-29 DIAGNOSIS — B1920 Unspecified viral hepatitis C without hepatic coma: Secondary | ICD-10-CM

## 2013-07-29 LAB — CBC WITH DIFFERENTIAL/PLATELET
BASO%: 0 % (ref 0.0–2.0)
EOS%: 1.9 % (ref 0.0–7.0)
HGB: 11 g/dL — ABNORMAL LOW (ref 13.0–17.1)
MCH: 30.1 pg (ref 27.2–33.4)
MCHC: 32.9 g/dL (ref 32.0–36.0)
RDW: 20.6 % — ABNORMAL HIGH (ref 11.0–14.6)
lymph#: 1.6 10*3/uL (ref 0.9–3.3)

## 2013-07-29 LAB — COMPREHENSIVE METABOLIC PANEL (CC13)
AST: 53 U/L — ABNORMAL HIGH (ref 5–34)
Albumin: 2.2 g/dL — ABNORMAL LOW (ref 3.5–5.0)
Alkaline Phosphatase: 153 U/L — ABNORMAL HIGH (ref 40–150)
Potassium: 3.5 mEq/L (ref 3.5–5.1)
Sodium: 139 mEq/L (ref 136–145)
Total Protein: 7.2 g/dL (ref 6.4–8.3)

## 2013-07-29 MED ORDER — FLUOROURACIL CHEMO INJECTION 2.5 GM/50ML
400.0000 mg/m2 | Freq: Once | INTRAVENOUS | Status: AC
  Administered 2013-07-29: 750 mg via INTRAVENOUS
  Filled 2013-07-29: qty 15

## 2013-07-29 MED ORDER — LEUCOVORIN CALCIUM INJECTION 350 MG
400.0000 mg/m2 | Freq: Once | INTRAVENOUS | Status: AC
  Administered 2013-07-29: 772 mg via INTRAVENOUS
  Filled 2013-07-29: qty 38.6

## 2013-07-29 MED ORDER — DEXAMETHASONE SODIUM PHOSPHATE 10 MG/ML IJ SOLN
INTRAMUSCULAR | Status: AC
  Filled 2013-07-29: qty 1

## 2013-07-29 MED ORDER — ONDANSETRON 8 MG/NS 50 ML IVPB
INTRAVENOUS | Status: AC
  Filled 2013-07-29: qty 8

## 2013-07-29 MED ORDER — ONDANSETRON 8 MG/50ML IVPB (CHCC)
8.0000 mg | Freq: Once | INTRAVENOUS | Status: AC
  Administered 2013-07-29: 8 mg via INTRAVENOUS

## 2013-07-29 MED ORDER — FLUOROURACIL CHEMO INJECTION 5 GM/100ML
2400.0000 mg/m2 | INTRAVENOUS | Status: DC
  Administered 2013-07-29: 4650 mg via INTRAVENOUS
  Filled 2013-07-29: qty 93

## 2013-07-29 MED ORDER — OXALIPLATIN CHEMO INJECTION 100 MG/20ML
85.0000 mg/m2 | Freq: Once | INTRAVENOUS | Status: AC
  Administered 2013-07-29: 165 mg via INTRAVENOUS
  Filled 2013-07-29: qty 33

## 2013-07-29 MED ORDER — DEXAMETHASONE SODIUM PHOSPHATE 10 MG/ML IJ SOLN
10.0000 mg | Freq: Once | INTRAMUSCULAR | Status: AC
  Administered 2013-07-29: 10 mg via INTRAVENOUS

## 2013-07-29 MED ORDER — DEXTROSE 5 % IV SOLN
Freq: Once | INTRAVENOUS | Status: AC
  Administered 2013-07-29: 13:00:00 via INTRAVENOUS

## 2013-07-29 NOTE — Progress Notes (Addendum)
OFFICE PROGRESS NOTE  Interval history:  Tony Barajas is a 57 year old man with metastatic colon cancer. He completed cycle 5 FOLFOX 07/15/2013. Restaging CT evaluation 07/25/2013 showed stability of some liver lesions and increase in size of others. Overall generalized decrease in abdominal lymphadenopathy.  He has mild intermittent nausea. No mouth sores. Periodic loose stools. He denies bleeding. No abdominal pain. The cold sensitivity lasted 10-12 days. He denies persistent neuropathy symptoms. No shortness breath, cough or fever. No skin rash. He denies abdominal pain.   Objective: Blood pressure 95/63, pulse 96, temperature 96.8 F (36 C), temperature source Oral, resp. rate 18, height 5\' 10"  (1.778 m), weight 157 lb 14.4 oz (71.623 kg).  Oropharynx is without thrush or ulceration. Lungs are clear. Regular cardiac rhythm. Port-A-Cath site is without erythema. Abdomen is soft and nontender. No hepatomegaly. Extremities without edema. Calves soft and nontender. Vibratory sense intact over the fingertips per tuning fork exam.  Lab Results: Lab Results  Component Value Date   WBC 4.3 07/29/2013   HGB 11.0* 07/29/2013   HCT 33.4* 07/29/2013   MCV 91.3 07/29/2013   PLT 197 07/29/2013    Chemistry:    Chemistry      Component Value Date/Time   NA 135* 07/15/2013 0856   NA 139 03/14/2013 1023   K 3.8 07/15/2013 0856   K 4.0 03/14/2013 1023   CL 104 03/14/2013 1023   CO2 23 07/15/2013 0856   CO2 22 03/14/2013 1023   BUN 9.2 07/15/2013 0856   BUN 10 03/14/2013 1023   CREATININE 0.7 07/15/2013 0856   CREATININE 0.7 03/14/2013 1023      Component Value Date/Time   CALCIUM 8.4 07/15/2013 0856   CALCIUM 9.1 03/14/2013 1023   ALKPHOS 156* 07/15/2013 0856   ALKPHOS 82 03/14/2013 1023   AST 57* 07/15/2013 0856   AST 33 03/14/2013 1023   ALT 21 07/15/2013 0856   ALT 26 03/14/2013 1023   BILITOT 0.50 07/15/2013 0856   BILITOT 0.5 03/14/2013 1023       Studies/Results: Ct Abdomen  Pelvis W Contrast  07/25/2013   CLINICAL DATA:  Colon cancer with liver metastases.  EXAM: CT ABDOMEN AND PELVIS WITH CONTRAST  TECHNIQUE: Multidetector CT imaging of the abdomen and pelvis was performed using the standard protocol following bolus administration of intravenous contrast.  CONTRAST:  OMNIPAQUE IOHEXOL 300 MG/ML  SOLN  COMPARISON:  04/15/2013  FINDINGS: The dominant lesion in the lateral segment of the left liver measures 12.3 cm today compared to 12.1 cm previously. A 2nd index lesion in the inferior right liver measures 6.4 cm today compared to 6.8 cm previously. Two adjacent lesions in the dome of the liver or almost the same size on the previous study with the more medial lesion measuring 2.2 cm in the more lateral lesion measuring 2.4 cm. However on today's exam, the more medial lesion has increased from 2.2 to 3.9 cm. The more lateral lesion has decreased from 2.4 cm down to 1.8 cm.  The spleen, stomach, duodenum, pancreas, gallbladder, and adrenal glands are unremarkable.  The upper abdominal lymphadenopathy is decreased in the interval. The 1.4 cm short axis gastrohepatic lymph node now measures 0.8 cm. The peripancreatic lymph node which was measured previously at 2.2 cm in short axis is now 1.4 cm. The portal caval lymph node has decreased from 1.8 cm previously to 0.9 cm today.  No abdominal aortic aneurysm. The kidneys are unremarkable.  There is edema/ inflammation in the right upper  quadrant adjacent to the lesion in the hepatic flexure consistent with primary neoplasm. 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  Imaging through the pelvis shows a mall amount of intraperitoneal free fluid. No pelvic sidewall lymphadenopathy. Bladder is unremarkable. Terminal ileum is normal. The appendix is normal.  Bone windows reveal no worrisome lytic or sclerotic osseous lesions.  IMPRESSION: Mixed response to therapy. Some of liver lesions appear  stable while others have clearly increased in size. This is associated with an overall generalized decrease in the abdominal lymphadenopathy.  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   Electronically Signed   By: Kennith Center M.D.   On: 07/25/2013 09:48    Medications: I have reviewed the patient's current medications.  Assessment/Plan:  1. Hepatic flexure mass with extensive liver metastases. Status post a colonoscopic biopsy on 04/14/2013 confirming at least high-grade glandular dysplasia.  Status post liver biopsy 05/05/2013 confirming metastatic colorectal adenocarcinoma.  Cycle 1 FOLFOX 05/14/2013.  Cycle 2 FOLFOX 05/27/2013.  Cycle 3 FOLFOX 06/10/2013.  Cycle 4 FOLFOX 06/24/2013. Cycle 5 FOLFOX 07/15/2013. Restaging CT evaluation 07/25/2013 showed some liver lesions were stable while others had increased in size. There was a generalized decrease in abdominal lymphadenopathy. 2. Microcytic anemia secondary to #1. He was transfused with PRBCs on 04/30/2013 when the hemoglobin returned at 6.5. He was also given a dose of IV iron. He continues oral iron. Hemoglobin continues to improve. 3. Hepatitis C positive. 4. Weight loss.  5. COPD. 6. Tobacco use. 7. History of alcohol use.  Disposition-Tony Barajas has completed 5 cycles of FOLFOX chemotherapy. Clinically he is better. The restaging CT evaluation showed stability of some liver lesions and an increase in the size of others. There was improvement in abdominal lymphadenopathy.  Dr. Truett Perna recommends continuation of FOLFOX with a repeat restaging CT evaluation after 4-5 more cycles. Mr. Bunte is in agreement with this plan.  Plan to proceed with cycle 6 today as scheduled. He will return for a followup visit and cycle 7 FOLFOX in 2 weeks.  Patient seen with Dr. Truett Perna.  Lonna Cobb ANP/GNP-BC    This was a shared visit with Lonna Cobb. I discussed the restaging CT  findings with Tony Barajas. There is CT evidence of a mixed response. His clinical status has improved as has the anemia. There was a one month interval between the baseline CT and initiation of chemotherapy. We decided to continue FOLFOX. We will plan a restaging CT after approximately 4 additional cycles.  Mancel Bale, M.D.

## 2013-07-29 NOTE — Telephone Encounter (Signed)
appts made and printed. Pt is aware that tx will be added. i emailed MW to add the tx's...td 

## 2013-07-29 NOTE — Progress Notes (Signed)
Patient reports he does not enjoy eating at this time.  He states between chemotherapy and having to take iron supplements he does not enjoy the taste of food.  He tries to stay away from ensure unless he just is unable to eat enough at meals.  Weight has increased slightly to 157.9 pounds on October 28, from 156.8 pounds October 14.  Patient denies difficulty with mouth sores or other nutrition side effects..  Nutrition diagnosis: Unintentional weight loss improved.  Intervention: Patient educated on strategies for improving taste of food.  I have enforced the importance of smaller, more frequent meals.  I suggested patient try something dry, bland, and salty after drinking ensure.  Teach back method used.  Monitoring, evaluation, goals: Patient will tolerate adequate calories and protein to promote weight stability.  Next visit: Tuesday, November 11, during chemotherapy.

## 2013-07-29 NOTE — Telephone Encounter (Signed)
Per staff message and POF I have scheduled appts.  JMW  

## 2013-07-29 NOTE — Patient Instructions (Signed)
[facility] Discharge Instructions for Patients Receiving Chemotherapy  Today you received the following chemotherapy agents Oxaliplatin/Leucovorin/5FU  To help prevent nausea and vomiting after your treatment, we encourage you to take your nausea medication as prescribed.   If you develop nausea and vomiting that is not controlled by your nausea medication, call the clinic.   BELOW ARE SYMPTOMS THAT SHOULD BE REPORTED IMMEDIATELY:  *FEVER GREATER THAN 100.5 F  *CHILLS WITH OR WITHOUT FEVER  NAUSEA AND VOMITING THAT IS NOT CONTROLLED WITH YOUR NAUSEA MEDICATION  *UNUSUAL SHORTNESS OF BREATH  *UNUSUAL BRUISING OR BLEEDING  TENDERNESS IN MOUTH AND THROAT WITH OR WITHOUT PRESENCE OF ULCERS  *URINARY PROBLEMS  *BOWEL PROBLEMS  UNUSUAL RASH Items with * indicate a potential emergency and should be followed up as soon as possible.  Feel free to call the clinic you have any questions or concerns. The clinic phone number is [phone].    

## 2013-07-31 ENCOUNTER — Ambulatory Visit (HOSPITAL_BASED_OUTPATIENT_CLINIC_OR_DEPARTMENT_OTHER): Payer: 59

## 2013-07-31 VITALS — BP 114/62 | HR 105 | Temp 98.7°F | Resp 19

## 2013-07-31 DIAGNOSIS — C787 Secondary malignant neoplasm of liver and intrahepatic bile duct: Secondary | ICD-10-CM

## 2013-07-31 DIAGNOSIS — C189 Malignant neoplasm of colon, unspecified: Secondary | ICD-10-CM

## 2013-07-31 DIAGNOSIS — C183 Malignant neoplasm of hepatic flexure: Secondary | ICD-10-CM

## 2013-07-31 DIAGNOSIS — Z452 Encounter for adjustment and management of vascular access device: Secondary | ICD-10-CM

## 2013-07-31 MED ORDER — HEPARIN SOD (PORK) LOCK FLUSH 100 UNIT/ML IV SOLN
500.0000 [IU] | Freq: Once | INTRAVENOUS | Status: AC | PRN
  Administered 2013-07-31: 500 [IU]
  Filled 2013-07-31: qty 5

## 2013-07-31 MED ORDER — SODIUM CHLORIDE 0.9 % IJ SOLN
10.0000 mL | INTRAMUSCULAR | Status: DC | PRN
  Administered 2013-07-31: 10 mL
  Filled 2013-07-31: qty 10

## 2013-07-31 NOTE — Patient Instructions (Signed)
Implanted Port Instructions  An implanted port is a central line that has a round shape and is placed under the skin. It is used for long-term IV (intravenous) access for:  · Medicine.  · Fluids.  · Liquid nutrition, such as TPN (total parenteral nutrition).  · Blood samples.  Ports can be placed:  · In the chest area just below the collarbone (this is the most common place.)  · In the arms.  · In the belly (abdomen) area.  · In the legs.  PARTS OF THE PORT  A port has 2 main parts:  · The reservoir. The reservoir is round, disc-shaped, and will be a small, raised area under your skin.  · The reservoir is the part where a needle is inserted (accessed) to either give medicines or to draw blood.  · The catheter. The catheter is a long, slender tube that extends from the reservoir. The catheter is placed into a large vein.  · Medicine that is inserted into the reservoir goes into the catheter and then into the vein.  INSERTION OF THE PORT  · The port is surgically placed in either an operating room or in a procedural area (interventional radiology).  · Medicine may be given to help you relax during the procedure.  · The skin where the port will be inserted is numbed (local anesthetic).  · 1 or 2 small cuts (incisions) will be made in the skin to insert the port.  · The port can be used after it has been inserted.  INCISION SITE CARE  · The incision site may have small adhesive strips on it. This helps keep the incision site closed. Sometimes, no adhesive strips are placed. Instead of adhesive strips, a special kind of surgical glue is used to keep the incision closed.  · If adhesive strips were placed on the incision sites, do not take them off. They will fall off on their own.  · The incision site may be sore for 1 to 2 days. Pain medicine can help.  · Do not get the incision site wet. Bathe or shower as directed by your caregiver.  · The incision site should heal in 5 to 7 days. A small scar may form after the  incision has healed.  ACCESSING THE PORT  Special steps must be taken to access the port:  · Before the port is accessed, a numbing cream can be placed on the skin. This helps numb the skin over the port site.  · A sterile technique is used to access the port.  · The port is accessed with a needle. Only "non-coring" port needles should be used to access the port. Once the port is accessed, a blood return should be checked. This helps ensure the port is in the vein and is not clogged (clotted).  · If your caregiver believes your port should remain accessed, a clear (transparent) bandage will be placed over the needle site. The bandage and needle will need to be changed every week or as directed by your caregiver.  · Keep the bandage covering the needle clean and dry. Do not get it wet. Follow your caregiver's instructions on how to take a shower or bath when the port is accessed.  · If your port does not need to stay accessed, no bandage is needed over the port.  FLUSHING THE PORT  Flushing the port keeps it from getting clogged. How often the port is flushed depends on:  · If a   constant infusion is running. If a constant infusion is running, the port may not need to be flushed.  · If intermittent medicines are given.  · If the port is not being used.  For intermittent medicines:  · The port will need to be flushed:  · After medicines have been given.  · After blood has been drawn.  · As part of routine maintenance.  · A port is normally flushed with:  · Normal saline.  · Heparin.  · Follow your caregiver's advice on how often, how much, and the type of flush to use on your port.  IMPORTANT PORT INFORMATION  · Tell your caregiver if you are allergic to heparin.  · After your port is placed, you will get a manufacturer's information card. The card has information about your port. Keep this card with you at all times.  · There are many types of ports available. Know what kind of port you have.  · In case of an  emergency, it may be helpful to wear a medical alert bracelet. This can help alert health care workers that you have a port.  · The port can stay in for as long as your caregiver believes it is necessary.  · When it is time for the port to come out, surgery will be done to remove it. The surgery will be similar to how the port was put in.  · If you are in the hospital or clinic:  · Your port will be taken care of and flushed by a nurse.  · If you are at home:  · A home health care nurse may give medicines and take care of the port.  · You or a family member can get special training and directions for giving medicine and taking care of the port at home.  SEEK IMMEDIATE MEDICAL CARE IF:   · Your port does not flush or you are unable to get a blood return.  · New drainage or pus is coming from the incision.  · A bad smell is coming from the incision site.  · You develop swelling or increased redness at the incision site.  · You develop increased swelling or pain at the port site.  · You develop swelling or pain in the surrounding skin near the port.  · You have an oral temperature above 102° F (38.9° C), not controlled by medicine.  MAKE SURE YOU:   · Understand these instructions.  · Will watch your condition.  · Will get help right away if you are not doing well or get worse.  Document Released: 09/18/2005 Document Revised: 12/11/2011 Document Reviewed: 12/10/2008  [employer]® Patient Information ©2014 [employer].

## 2013-08-10 ENCOUNTER — Other Ambulatory Visit: Payer: Self-pay | Admitting: Oncology

## 2013-08-12 ENCOUNTER — Ambulatory Visit: Payer: 59 | Admitting: Nutrition

## 2013-08-12 ENCOUNTER — Ambulatory Visit (HOSPITAL_BASED_OUTPATIENT_CLINIC_OR_DEPARTMENT_OTHER): Payer: 59 | Admitting: Nurse Practitioner

## 2013-08-12 ENCOUNTER — Telehealth: Payer: Self-pay | Admitting: Oncology

## 2013-08-12 ENCOUNTER — Other Ambulatory Visit (HOSPITAL_BASED_OUTPATIENT_CLINIC_OR_DEPARTMENT_OTHER): Payer: 59 | Admitting: Lab

## 2013-08-12 ENCOUNTER — Ambulatory Visit (HOSPITAL_BASED_OUTPATIENT_CLINIC_OR_DEPARTMENT_OTHER): Payer: 59

## 2013-08-12 VITALS — BP 93/53 | HR 107 | Temp 97.3°F | Resp 18 | Ht 70.0 in | Wt 153.8 lb

## 2013-08-12 DIAGNOSIS — F1021 Alcohol dependence, in remission: Secondary | ICD-10-CM

## 2013-08-12 DIAGNOSIS — F17200 Nicotine dependence, unspecified, uncomplicated: Secondary | ICD-10-CM

## 2013-08-12 DIAGNOSIS — B1920 Unspecified viral hepatitis C without hepatic coma: Secondary | ICD-10-CM

## 2013-08-12 DIAGNOSIS — D509 Iron deficiency anemia, unspecified: Secondary | ICD-10-CM

## 2013-08-12 DIAGNOSIS — R634 Abnormal weight loss: Secondary | ICD-10-CM

## 2013-08-12 DIAGNOSIS — C787 Secondary malignant neoplasm of liver and intrahepatic bile duct: Secondary | ICD-10-CM

## 2013-08-12 DIAGNOSIS — C189 Malignant neoplasm of colon, unspecified: Secondary | ICD-10-CM

## 2013-08-12 DIAGNOSIS — J449 Chronic obstructive pulmonary disease, unspecified: Secondary | ICD-10-CM

## 2013-08-12 DIAGNOSIS — Z5111 Encounter for antineoplastic chemotherapy: Secondary | ICD-10-CM

## 2013-08-12 DIAGNOSIS — G4700 Insomnia, unspecified: Secondary | ICD-10-CM

## 2013-08-12 LAB — COMPREHENSIVE METABOLIC PANEL (CC13)
ALT: 21 U/L (ref 0–55)
AST: 47 U/L — ABNORMAL HIGH (ref 5–34)
Alkaline Phosphatase: 141 U/L (ref 40–150)
BUN: 10.2 mg/dL (ref 7.0–26.0)
Creatinine: 0.6 mg/dL — ABNORMAL LOW (ref 0.7–1.3)
Total Bilirubin: 0.43 mg/dL (ref 0.20–1.20)

## 2013-08-12 LAB — CBC WITH DIFFERENTIAL/PLATELET
BASO%: 0.2 % (ref 0.0–2.0)
EOS%: 1.7 % (ref 0.0–7.0)
Eosinophils Absolute: 0.1 10*3/uL (ref 0.0–0.5)
HCT: 32.5 % — ABNORMAL LOW (ref 38.4–49.9)
LYMPH%: 26.8 % (ref 14.0–49.0)
MCH: 30.4 pg (ref 27.2–33.4)
MCHC: 32.6 g/dL (ref 32.0–36.0)
MCV: 93.1 fL (ref 79.3–98.0)
MONO%: 19.4 % — ABNORMAL HIGH (ref 0.0–14.0)
NEUT%: 51.9 % (ref 39.0–75.0)
Platelets: 209 10*3/uL (ref 140–400)
RBC: 3.49 10*6/uL — ABNORMAL LOW (ref 4.20–5.82)
WBC: 4.2 10*3/uL (ref 4.0–10.3)
nRBC: 0 % (ref 0–0)

## 2013-08-12 MED ORDER — FLUOROURACIL CHEMO INJECTION 2.5 GM/50ML
400.0000 mg/m2 | Freq: Once | INTRAVENOUS | Status: AC
  Administered 2013-08-12: 750 mg via INTRAVENOUS
  Filled 2013-08-12: qty 15

## 2013-08-12 MED ORDER — DEXAMETHASONE SODIUM PHOSPHATE 10 MG/ML IJ SOLN
10.0000 mg | Freq: Once | INTRAMUSCULAR | Status: AC
  Administered 2013-08-12: 10 mg via INTRAVENOUS

## 2013-08-12 MED ORDER — ONDANSETRON 8 MG/50ML IVPB (CHCC)
8.0000 mg | Freq: Once | INTRAVENOUS | Status: AC
  Administered 2013-08-12: 8 mg via INTRAVENOUS

## 2013-08-12 MED ORDER — OXALIPLATIN CHEMO INJECTION 100 MG/20ML
85.0000 mg/m2 | Freq: Once | INTRAVENOUS | Status: AC
  Administered 2013-08-12: 165 mg via INTRAVENOUS
  Filled 2013-08-12: qty 33

## 2013-08-12 MED ORDER — ONDANSETRON 8 MG/NS 50 ML IVPB
INTRAVENOUS | Status: AC
  Filled 2013-08-12: qty 8

## 2013-08-12 MED ORDER — ZOLPIDEM TARTRATE 5 MG PO TABS: 5.0000 mg | ORAL_TABLET | Freq: Every evening | ORAL | Status: DC | PRN

## 2013-08-12 MED ORDER — SODIUM CHLORIDE 0.9 % IV SOLN
2400.0000 mg/m2 | INTRAVENOUS | Status: DC
  Administered 2013-08-12: 4650 mg via INTRAVENOUS
  Filled 2013-08-12: qty 93

## 2013-08-12 MED ORDER — TRAMADOL HCL 50 MG PO TABS: 50.0000 mg | ORAL_TABLET | Freq: Four times a day (QID) | ORAL | Status: DC | PRN

## 2013-08-12 MED ORDER — DEXAMETHASONE SODIUM PHOSPHATE 10 MG/ML IJ SOLN
INTRAMUSCULAR | Status: AC
  Filled 2013-08-12: qty 1

## 2013-08-12 MED ORDER — LEUCOVORIN CALCIUM INJECTION 350 MG
400.0000 mg/m2 | Freq: Once | INTRAVENOUS | Status: AC
  Administered 2013-08-12: 772 mg via INTRAVENOUS
  Filled 2013-08-12: qty 38.6

## 2013-08-12 MED ORDER — DEXTROSE 5 % IV SOLN
Freq: Once | INTRAVENOUS | Status: AC
  Administered 2013-08-12: 10:00:00 via INTRAVENOUS

## 2013-08-12 NOTE — Progress Notes (Signed)
Patient continues with chemotherapy for metastatic colon cancer.  Patient had diarrhea after his last chemotherapy treatment.  Weight declined, and was documented as 153.8 pounds November 11 from 157.9 pounds on October 28.  Patient continues to complain of the taste of food and poor appetite.  He basically forces himself to eat when he can.  He denies mouth sores.  Nutrition diagnosis: Unintentional weight loss continues.  Intervention: Patient educated to continue small frequent meals with higher calorie, higher protein foods.  I have reviewed strategies for eating if patient develops diarrhea.  I provided him with a fact sheet.  Questions were answered.  Teach back method used.  Monitoring, evaluation, goals: Patient will be able to increase oral intake to minimize further weight loss.  Next visit: Tuesday, December 2, during chemotherapy.

## 2013-08-12 NOTE — Telephone Encounter (Signed)
gv and printed appt sched and avs for pt for NOV and DEc...sed added tx. °

## 2013-08-12 NOTE — Progress Notes (Signed)
OFFICE PROGRESS NOTE  Interval history:  Tony Barajas returns for followup of metastatic colon cancer. He completed cycle 6 FOLFOX on 07/29/2013. He denies nausea/vomiting. No mouth sores. He had diarrhea for 3 days after the pump was discontinued. He noted that the stools were initially "dark". The stools have since returned to normal color. He has mild persistent cold sensitivity. No numbness or tingling in the absence of cold exposure. He has an intermittent abdominal "ache". He takes tramadol as needed. He does not require tramadol on a daily basis.   Objective: Blood pressure 93/53, pulse 107, temperature 97.3 F (36.3 C), temperature source Oral, resp. rate 18, height 5\' 10"  (1.778 m), weight 153 lb 12.8 oz (69.763 kg), SpO2 100.00%.  Oropharynx is without thrush or ulceration. No palpable cervical or supraclavicular lymph nodes. Lungs are clear. No wheezes or rales. Regular cardiac rhythm. Port-A-Cath site is without erythema. Abdomen is soft. Tender at the right upper quadrant. No hepatomegaly. Extremities without edema. Calves soft and nontender. Vibratory sense mildly decreased over the fingertips per tuning fork exam. No skin rash.  Lab Results: Lab Results  Component Value Date   WBC 4.2 08/12/2013   HGB 10.6* 08/12/2013   HCT 32.5* 08/12/2013   MCV 93.1 08/12/2013   PLT 209 08/12/2013    Chemistry:    Chemistry      Component Value Date/Time   NA 139 07/29/2013 1053   NA 139 03/14/2013 1023   K 3.5 07/29/2013 1053   K 4.0 03/14/2013 1023   CL 104 03/14/2013 1023   CO2 22 07/29/2013 1053   CO2 22 03/14/2013 1023   BUN 7.5 07/29/2013 1053   BUN 10 03/14/2013 1023   CREATININE 0.7 07/29/2013 1053   CREATININE 0.7 03/14/2013 1023      Component Value Date/Time   CALCIUM 8.2* 07/29/2013 1053   CALCIUM 9.1 03/14/2013 1023   ALKPHOS 153* 07/29/2013 1053   ALKPHOS 82 03/14/2013 1023   AST 53* 07/29/2013 1053   AST 33 03/14/2013 1023   ALT 25 07/29/2013 1053   ALT 26  03/14/2013 1023   BILITOT 0.29 07/29/2013 1053   BILITOT 0.5 03/14/2013 1023       Studies/Results: Ct Abdomen Pelvis W Contrast  07/25/2013   CLINICAL DATA:  Colon cancer with liver metastases.  EXAM: CT ABDOMEN AND PELVIS WITH CONTRAST  TECHNIQUE: Multidetector CT imaging of the abdomen and pelvis was performed using the standard protocol following bolus administration of intravenous contrast.  CONTRAST:  OMNIPAQUE IOHEXOL 300 MG/ML  SOLN  COMPARISON:  04/15/2013  FINDINGS: The dominant lesion in the lateral segment of the left liver measures 12.3 cm today compared to 12.1 cm previously. A 2nd index lesion in the inferior right liver measures 6.4 cm today compared to 6.8 cm previously. Two adjacent lesions in the dome of the liver or almost the same size on the previous study with the more medial lesion measuring 2.2 cm in the more lateral lesion measuring 2.4 cm. However on today's exam, the more medial lesion has increased from 2.2 to 3.9 cm. The more lateral lesion has decreased from 2.4 cm down to 1.8 cm.  The spleen, stomach, duodenum, pancreas, gallbladder, and adrenal glands are unremarkable.  The upper abdominal lymphadenopathy is decreased in the interval. The 1.4 cm short axis gastrohepatic lymph node now measures 0.8 cm. The peripancreatic lymph node which was measured previously at 2.2 cm in short axis is now 1.4 cm. The portal caval lymph node has decreased  from 1.8 cm previously to 0.9 cm today.  No abdominal aortic aneurysm. The kidneys are unremarkable.  There is edema/ inflammation in the right upper quadrant adjacent to the lesion in the hepatic flexure consistent with primary neoplasm. 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  Imaging through the pelvis shows a mall amount of intraperitoneal free fluid. No pelvic sidewall lymphadenopathy. Bladder is unremarkable. Terminal ileum is normal. The appendix is normal.  Bone windows reveal no  worrisome lytic or sclerotic osseous lesions.  IMPRESSION: Mixed response to therapy. Some of liver lesions appear stable while others have clearly increased in size. This is associated with an overall generalized decrease in the abdominal lymphadenopathy.  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   Electronically Signed   By: Kennith Center M.D.   On: 07/25/2013 09:48    Medications: I have reviewed the patient's current medications.  Assessment/Plan:  1. Hepatic flexure mass with extensive liver metastases. Status post a colonoscopic biopsy on 04/14/2013 confirming at least high-grade glandular dysplasia.  Status post liver biopsy 05/05/2013 confirming metastatic colorectal adenocarcinoma.  Cycle 1 FOLFOX 05/14/2013.  Cycle 2 FOLFOX 05/27/2013.  Cycle 3 FOLFOX 06/10/2013.  Cycle 4 FOLFOX 06/24/2013.  Cycle 5 FOLFOX 07/15/2013.  Restaging CT evaluation 07/25/2013 showed some liver lesions were stable while others had increased in size. There was a generalized decrease in abdominal lymphadenopathy. Cycle 6 of FOLFOX 07/29/2013. 2. Microcytic anemia secondary to #1. He was transfused with PRBCs on 04/30/2013 when the hemoglobin returned at 6.5. He was also given a dose of IV iron. He continues oral iron.  3. Hepatitis C positive. 4. Weight loss.  5. COPD. 6. Tobacco use. 7. History of alcohol use. 8. Diarrhea following cycle 6 FOLFOX. He will obtain Imodium. He will contact the office if Imodium is not effective or he again notes "dark" stools.  Disposition-Tony Barajas appears stable. He has completed 6 cycles of FOLFOX chemotherapy. Plan to proceed with cycle 7 today as scheduled. He will return for a followup visit and cycle 8 FOLFOX on 09/02/2013. He will contact the office in the interim as outlined above or with any other problems.  Lonna Cobb ANP/GNP-BC

## 2013-08-12 NOTE — Patient Instructions (Signed)
[facility] Discharge Instructions for Patients Receiving Chemotherapy  Today you received the following chemotherapy agents 5 FU/ Leucovorin/Oxaliplatin  To help prevent nausea and vomiting after your treatment, we encourage you to take your nausea medication as prescribed.  If you develop nausea and vomiting that is not controlled by your nausea medication, call the clinic.   BELOW ARE SYMPTOMS THAT SHOULD BE REPORTED IMMEDIATELY:  *FEVER GREATER THAN 100.5 F  *CHILLS WITH OR WITHOUT FEVER  NAUSEA AND VOMITING THAT IS NOT CONTROLLED WITH YOUR NAUSEA MEDICATION  *UNUSUAL SHORTNESS OF BREATH  *UNUSUAL BRUISING OR BLEEDING  TENDERNESS IN MOUTH AND THROAT WITH OR WITHOUT PRESENCE OF ULCERS  *URINARY PROBLEMS  *BOWEL PROBLEMS  UNUSUAL RASH Items with * indicate a potential emergency and should be followed up as soon as possible.  Feel free to call the clinic you have any questions or concerns. The clinic phone number is [phone].     

## 2013-08-13 ENCOUNTER — Other Ambulatory Visit: Payer: Self-pay | Admitting: Oncology

## 2013-08-14 ENCOUNTER — Ambulatory Visit (HOSPITAL_BASED_OUTPATIENT_CLINIC_OR_DEPARTMENT_OTHER): Payer: 59

## 2013-08-14 VITALS — BP 91/63 | HR 112 | Temp 97.7°F

## 2013-08-14 DIAGNOSIS — C787 Secondary malignant neoplasm of liver and intrahepatic bile duct: Secondary | ICD-10-CM

## 2013-08-14 DIAGNOSIS — C183 Malignant neoplasm of hepatic flexure: Secondary | ICD-10-CM

## 2013-08-14 DIAGNOSIS — Z452 Encounter for adjustment and management of vascular access device: Secondary | ICD-10-CM

## 2013-08-14 DIAGNOSIS — C189 Malignant neoplasm of colon, unspecified: Secondary | ICD-10-CM

## 2013-08-14 MED ORDER — HEPARIN SOD (PORK) LOCK FLUSH 100 UNIT/ML IV SOLN
500.0000 [IU] | Freq: Once | INTRAVENOUS | Status: AC | PRN
  Administered 2013-08-14: 500 [IU]
  Filled 2013-08-14: qty 5

## 2013-08-14 MED ORDER — SODIUM CHLORIDE 0.9 % IJ SOLN
10.0000 mL | INTRAMUSCULAR | Status: DC | PRN
  Administered 2013-08-14: 10 mL
  Filled 2013-08-14: qty 10

## 2013-08-30 ENCOUNTER — Other Ambulatory Visit: Payer: Self-pay | Admitting: Oncology

## 2013-09-02 ENCOUNTER — Ambulatory Visit: Payer: 59 | Admitting: Nutrition

## 2013-09-02 ENCOUNTER — Telehealth: Payer: Self-pay | Admitting: Oncology

## 2013-09-02 ENCOUNTER — Ambulatory Visit (HOSPITAL_BASED_OUTPATIENT_CLINIC_OR_DEPARTMENT_OTHER): Payer: 59 | Admitting: Oncology

## 2013-09-02 ENCOUNTER — Telehealth: Payer: Self-pay | Admitting: *Deleted

## 2013-09-02 ENCOUNTER — Ambulatory Visit (HOSPITAL_BASED_OUTPATIENT_CLINIC_OR_DEPARTMENT_OTHER): Payer: 59

## 2013-09-02 ENCOUNTER — Other Ambulatory Visit (HOSPITAL_BASED_OUTPATIENT_CLINIC_OR_DEPARTMENT_OTHER): Payer: 59 | Admitting: Lab

## 2013-09-02 VITALS — BP 112/80 | HR 121 | Temp 97.6°F | Resp 20 | Ht 70.0 in | Wt 151.0 lb

## 2013-09-02 DIAGNOSIS — C787 Secondary malignant neoplasm of liver and intrahepatic bile duct: Secondary | ICD-10-CM

## 2013-09-02 DIAGNOSIS — C189 Malignant neoplasm of colon, unspecified: Secondary | ICD-10-CM

## 2013-09-02 DIAGNOSIS — D509 Iron deficiency anemia, unspecified: Secondary | ICD-10-CM

## 2013-09-02 DIAGNOSIS — B1920 Unspecified viral hepatitis C without hepatic coma: Secondary | ICD-10-CM

## 2013-09-02 DIAGNOSIS — Z5111 Encounter for antineoplastic chemotherapy: Secondary | ICD-10-CM

## 2013-09-02 DIAGNOSIS — C183 Malignant neoplasm of hepatic flexure: Secondary | ICD-10-CM

## 2013-09-02 LAB — CBC WITH DIFFERENTIAL/PLATELET
Basophils Absolute: 0 10*3/uL (ref 0.0–0.1)
EOS%: 0.3 % (ref 0.0–7.0)
Eosinophils Absolute: 0 10*3/uL (ref 0.0–0.5)
HCT: 34.7 % — ABNORMAL LOW (ref 38.4–49.9)
HGB: 11.5 g/dL — ABNORMAL LOW (ref 13.0–17.1)
MCH: 31.3 pg (ref 27.2–33.4)
MCHC: 33.1 g/dL (ref 32.0–36.0)
MCV: 94.6 fL (ref 79.3–98.0)
MONO%: 20.6 % — ABNORMAL HIGH (ref 0.0–14.0)
NEUT#: 5.6 10*3/uL (ref 1.5–6.5)
NEUT%: 61.6 % (ref 39.0–75.0)
Platelets: 413 10*3/uL — ABNORMAL HIGH (ref 140–400)
RDW: 17.1 % — ABNORMAL HIGH (ref 11.0–14.6)

## 2013-09-02 LAB — COMPREHENSIVE METABOLIC PANEL (CC13)
AST: 53 U/L — ABNORMAL HIGH (ref 5–34)
Albumin: 2.1 g/dL — ABNORMAL LOW (ref 3.5–5.0)
Alkaline Phosphatase: 187 U/L — ABNORMAL HIGH (ref 40–150)
BUN: 9.4 mg/dL (ref 7.0–26.0)
Calcium: 8.5 mg/dL (ref 8.4–10.4)
Creatinine: 0.6 mg/dL — ABNORMAL LOW (ref 0.7–1.3)
Glucose: 101 mg/dl (ref 70–140)
Potassium: 3.9 mEq/L (ref 3.5–5.1)
Sodium: 133 mEq/L — ABNORMAL LOW (ref 136–145)
Total Protein: 7.6 g/dL (ref 6.4–8.3)

## 2013-09-02 MED ORDER — DEXTROSE 5 % IV SOLN
500.0000 mL | Freq: Once | INTRAVENOUS | Status: AC
  Administered 2013-09-02: 500 mL via INTRAVENOUS

## 2013-09-02 MED ORDER — SODIUM CHLORIDE 0.9 % IV SOLN
2400.0000 mg/m2 | INTRAVENOUS | Status: DC
  Administered 2013-09-02: 4650 mg via INTRAVENOUS
  Filled 2013-09-02: qty 93

## 2013-09-02 MED ORDER — OXALIPLATIN CHEMO INJECTION 100 MG/20ML
65.0000 mg/m2 | Freq: Once | INTRAVENOUS | Status: AC
  Administered 2013-09-02: 125 mg via INTRAVENOUS
  Filled 2013-09-02: qty 25

## 2013-09-02 MED ORDER — DEXTROSE 5 % IV SOLN: Freq: Once | INTRAVENOUS | Status: DC

## 2013-09-02 MED ORDER — FLUOROURACIL CHEMO INJECTION 2.5 GM/50ML
400.0000 mg/m2 | Freq: Once | INTRAVENOUS | Status: AC
  Administered 2013-09-02: 750 mg via INTRAVENOUS
  Filled 2013-09-02: qty 15

## 2013-09-02 MED ORDER — ONDANSETRON 8 MG/NS 50 ML IVPB
INTRAVENOUS | Status: AC
  Filled 2013-09-02: qty 8

## 2013-09-02 MED ORDER — PALONOSETRON HCL INJECTION 0.25 MG/5ML
0.2500 mg | Freq: Once | INTRAVENOUS | Status: AC
  Administered 2013-09-02: 0.25 mg via INTRAVENOUS

## 2013-09-02 MED ORDER — LEUCOVORIN CALCIUM INJECTION 350 MG
750.0000 mg | Freq: Once | INTRAVENOUS | Status: AC
  Administered 2013-09-02: 750 mg via INTRAVENOUS
  Filled 2013-09-02: qty 37.5

## 2013-09-02 MED ORDER — DEXAMETHASONE SODIUM PHOSPHATE 10 MG/ML IJ SOLN
INTRAMUSCULAR | Status: AC
  Filled 2013-09-02: qty 1

## 2013-09-02 MED ORDER — PALONOSETRON HCL INJECTION 0.25 MG/5ML
INTRAVENOUS | Status: AC
  Filled 2013-09-02: qty 5

## 2013-09-02 MED ORDER — DEXAMETHASONE SODIUM PHOSPHATE 10 MG/ML IJ SOLN
10.0000 mg | Freq: Once | INTRAMUSCULAR | Status: AC
  Administered 2013-09-02: 10 mg via INTRAVENOUS

## 2013-09-02 NOTE — Patient Instructions (Signed)
[facility] Discharge Instructions for Patients Receiving Chemotherapy  Today you received the following chemotherapy agents oxaliplatin, leucovorin and adrucil and an adrucil pump.  To help prevent nausea and vomiting after your treatment, we encourage you to take your nausea medication compazine.   If you develop nausea and vomiting that is not controlled by your nausea medication, call the clinic.   BELOW ARE SYMPTOMS THAT SHOULD BE REPORTED IMMEDIATELY:  *FEVER GREATER THAN 100.5 F  *CHILLS WITH OR WITHOUT FEVER  NAUSEA AND VOMITING THAT IS NOT CONTROLLED WITH YOUR NAUSEA MEDICATION  *UNUSUAL SHORTNESS OF BREATH  *UNUSUAL BRUISING OR BLEEDING  TENDERNESS IN MOUTH AND THROAT WITH OR WITHOUT PRESENCE OF ULCERS  *URINARY PROBLEMS  *BOWEL PROBLEMS  UNUSUAL RASH Items with * indicate a potential emergency and should be followed up as soon as possible.  Feel free to call the clinic you have any questions or concerns. The clinic phone number is [phone].

## 2013-09-02 NOTE — Progress Notes (Signed)
Nutrition followup completed with patient who continues chemotherapy for metastatic colon cancer.  Patient reports he is not doing very well.  His weight continues to decline and was documented as 151 pounds on December 2, which is decreased from 153.8 pounds November 11.  He continues to complain of taste alterations and poor appetite.  He refuses to push oral intake when he does not feel well.  Patient does report increased tolerance to Ensure Plus and is requesting additional samples.  Nutrition diagnosis: Unintentional weight loss continues.  Intervention: Patient educated on strategies for increased oral intake to minimize  further weight loss. Patient provided with second case of Ensure Plus.  Monitoring, evaluation, goals: Patient will tolerate adequate calories and protein to minimize further weight loss.  Next visit: Tuesday, December 16, during chemotherapy.

## 2013-09-02 NOTE — Progress Notes (Signed)
   [facility]    OFFICE PROGRESS NOTE   INTERVAL HISTORY:   He completed another cycle of FOLFOX on 08/12/2013. He reports cold sensitivity for 2 weeks following chemotherapy. He has a poor appetite when he is unable to drink cold liquids. Intermittent right low abdominal pain. He had an episode of "dry heaves "this morning. He has been eating and drinking well for the past several days. No difficulty with shirt buttoning, holding objects, or turning pages.  Objective:  Vital signs in last 24 hours:  Blood pressure 112/80, pulse 121, temperature 97.6 F (36.4 C), temperature source Oral, resp. rate 20, height 5\' 10"  (1.778 m), weight 151 lb (68.493 kg).    HEENT: No thrush or ulcers Resp: Lungs clear bilaterally Cardio: Regular rate and rhythm, tachycardia GI: No hepatomegaly, tender in the right lower abdomen. No mass. Vascular: No leg edema Neuro: Very mild decrease in vibratory sense at the fingertips bilaterally    Portacath/PICC-without erythema  Lab Results:  Lab Results  Component Value Date   WBC 9.1 09/02/2013   HGB 11.5* 09/02/2013   HCT 34.7* 09/02/2013   MCV 94.6 09/02/2013   PLT 413* 09/02/2013   5.6   Medications: I have reviewed the patient's current medications.  Assessment/Plan: 1. Hepatic flexure mass with extensive liver metastases. Status post a colonoscopic biopsy on 04/14/2013 confirming at least high-grade glandular dysplasia.  Status post liver biopsy 05/05/2013 confirming metastatic colorectal adenocarcinoma.  Cycle 1 FOLFOX 05/14/2013.  Cycle 2 FOLFOX 05/27/2013.  Cycle 3 FOLFOX 06/10/2013.  Cycle 4 FOLFOX 06/24/2013.  Cycle 5 FOLFOX 07/15/2013.  Restaging CT evaluation 07/25/2013 showed some liver lesions were stable while others had increased in size. There was a generalized decrease in abdominal lymphadenopathy.  Cycle 6 of FOLFOX 07/29/2013. Cycle 7 of FOLFOX 08/12/2013 2. Microcytic anemia secondary to #1. He was  transfused with PRBCs on 04/30/2013 when the hemoglobin returned at 6.5. He was also given a dose of IV iron. He continues oral iron.  3. Hepatitis C positive. 4. Weight loss.  5. COPD. 6. Tobacco use. 7. History of alcohol use. 8. Nausea following cycle 7 FOLFOX-prolonged period, he reports relief of the nausea with Compazine. We will add Aloxi today.   Disposition:  He has completed 7 cycles of FOLFOX. The plan is to proceed with cycle 8 today. We will dose reduce the oxaliplatin secondary to the prolonged cold sensitivity. His overall performance status appears diminished. The plan is to complete a restaging CT evaluation after cycle 9 FOLFOX. He will contact us for persistent nausea or new symptoms.   Thornton Papas, MD  09/02/2013  10:26 AM

## 2013-09-02 NOTE — Telephone Encounter (Signed)
Gave pt appt for lab and Md for December 2014 °

## 2013-09-02 NOTE — Telephone Encounter (Signed)
Per staff message and POF I have scheduled appts.  JMW  

## 2013-09-03 ENCOUNTER — Telehealth: Payer: Self-pay | Admitting: Oncology

## 2013-09-03 NOTE — Telephone Encounter (Signed)
Called pt and left message regarding lab, md and chemo for december 2014

## 2013-09-04 ENCOUNTER — Ambulatory Visit (HOSPITAL_BASED_OUTPATIENT_CLINIC_OR_DEPARTMENT_OTHER): Payer: 59

## 2013-09-04 VITALS — BP 135/89 | HR 112 | Temp 96.9°F | Resp 20

## 2013-09-04 DIAGNOSIS — C183 Malignant neoplasm of hepatic flexure: Secondary | ICD-10-CM

## 2013-09-04 DIAGNOSIS — Z452 Encounter for adjustment and management of vascular access device: Secondary | ICD-10-CM

## 2013-09-04 DIAGNOSIS — C787 Secondary malignant neoplasm of liver and intrahepatic bile duct: Secondary | ICD-10-CM

## 2013-09-04 DIAGNOSIS — C189 Malignant neoplasm of colon, unspecified: Secondary | ICD-10-CM

## 2013-09-04 MED ORDER — SODIUM CHLORIDE 0.9 % IJ SOLN
10.0000 mL | INTRAMUSCULAR | Status: DC | PRN
  Administered 2013-09-04: 10 mL
  Filled 2013-09-04: qty 10

## 2013-09-04 MED ORDER — HEPARIN SOD (PORK) LOCK FLUSH 100 UNIT/ML IV SOLN
500.0000 [IU] | Freq: Once | INTRAVENOUS | Status: AC | PRN
  Administered 2013-09-04: 500 [IU]
  Filled 2013-09-04: qty 5

## 2013-09-04 NOTE — Patient Instructions (Signed)
Call for any problems.

## 2013-09-14 ENCOUNTER — Other Ambulatory Visit: Payer: Self-pay | Admitting: Oncology

## 2013-09-16 ENCOUNTER — Ambulatory Visit: Payer: 59 | Admitting: Nutrition

## 2013-09-16 ENCOUNTER — Ambulatory Visit (HOSPITAL_BASED_OUTPATIENT_CLINIC_OR_DEPARTMENT_OTHER): Payer: 59

## 2013-09-16 ENCOUNTER — Ambulatory Visit (HOSPITAL_BASED_OUTPATIENT_CLINIC_OR_DEPARTMENT_OTHER): Payer: 59 | Admitting: Nurse Practitioner

## 2013-09-16 ENCOUNTER — Other Ambulatory Visit (HOSPITAL_BASED_OUTPATIENT_CLINIC_OR_DEPARTMENT_OTHER): Payer: 59

## 2013-09-16 ENCOUNTER — Telehealth: Payer: Self-pay | Admitting: Oncology

## 2013-09-16 VITALS — BP 95/71 | HR 124 | Temp 97.0°F | Resp 18 | Ht 70.0 in | Wt 144.4 lb

## 2013-09-16 DIAGNOSIS — R630 Anorexia: Secondary | ICD-10-CM

## 2013-09-16 DIAGNOSIS — C189 Malignant neoplasm of colon, unspecified: Secondary | ICD-10-CM

## 2013-09-16 DIAGNOSIS — F17200 Nicotine dependence, unspecified, uncomplicated: Secondary | ICD-10-CM

## 2013-09-16 DIAGNOSIS — C787 Secondary malignant neoplasm of liver and intrahepatic bile duct: Secondary | ICD-10-CM

## 2013-09-16 DIAGNOSIS — C183 Malignant neoplasm of hepatic flexure: Secondary | ICD-10-CM

## 2013-09-16 DIAGNOSIS — D509 Iron deficiency anemia, unspecified: Secondary | ICD-10-CM

## 2013-09-16 DIAGNOSIS — R634 Abnormal weight loss: Secondary | ICD-10-CM

## 2013-09-16 DIAGNOSIS — J449 Chronic obstructive pulmonary disease, unspecified: Secondary | ICD-10-CM

## 2013-09-16 DIAGNOSIS — J4489 Other specified chronic obstructive pulmonary disease: Secondary | ICD-10-CM

## 2013-09-16 DIAGNOSIS — B1920 Unspecified viral hepatitis C without hepatic coma: Secondary | ICD-10-CM

## 2013-09-16 DIAGNOSIS — Z5111 Encounter for antineoplastic chemotherapy: Secondary | ICD-10-CM

## 2013-09-16 LAB — CBC WITH DIFFERENTIAL/PLATELET
BASO%: 0.2 % (ref 0.0–2.0)
Basophils Absolute: 0 10*3/uL (ref 0.0–0.1)
EOS%: 2 % (ref 0.0–7.0)
Eosinophils Absolute: 0.1 10*3/uL (ref 0.0–0.5)
HCT: 33.4 % — ABNORMAL LOW (ref 38.4–49.9)
HGB: 10.6 g/dL — ABNORMAL LOW (ref 13.0–17.1)
LYMPH%: 27.2 % (ref 14.0–49.0)
MCH: 31.1 pg (ref 27.2–33.4)
MCHC: 31.7 g/dL — ABNORMAL LOW (ref 32.0–36.0)
MCV: 97.9 fL (ref 79.3–98.0)
MONO#: 0.8 10*3/uL (ref 0.1–0.9)
MONO%: 15.9 % — ABNORMAL HIGH (ref 0.0–14.0)
NEUT#: 2.8 10*3/uL (ref 1.5–6.5)
NEUT%: 54.7 % (ref 39.0–75.0)
Platelets: 261 10*3/uL (ref 140–400)
RBC: 3.41 10*6/uL — ABNORMAL LOW (ref 4.20–5.82)
RDW: 18.2 % — ABNORMAL HIGH (ref 11.0–14.6)
WBC: 5 10*3/uL (ref 4.0–10.3)
lymph#: 1.4 10*3/uL (ref 0.9–3.3)
nRBC: 0 % (ref 0–0)

## 2013-09-16 LAB — COMPREHENSIVE METABOLIC PANEL (CC13)
ALT: 16 U/L (ref 0–55)
Alkaline Phosphatase: 130 U/L (ref 40–150)
Anion Gap: 11 mEq/L (ref 3–11)
BUN: 12.6 mg/dL (ref 7.0–26.0)
CO2: 21 mEq/L — ABNORMAL LOW (ref 22–29)
Creatinine: 0.6 mg/dL — ABNORMAL LOW (ref 0.7–1.3)
Glucose: 103 mg/dl (ref 70–140)
Sodium: 139 mEq/L (ref 136–145)
Total Bilirubin: 0.36 mg/dL (ref 0.20–1.20)

## 2013-09-16 MED ORDER — SODIUM CHLORIDE 0.9 % IV SOLN
INTRAVENOUS | Status: DC
  Administered 2013-09-16: 14:00:00 via INTRAVENOUS

## 2013-09-16 MED ORDER — ALTEPLASE 2 MG IJ SOLR
2.0000 mg | Freq: Once | INTRAMUSCULAR | Status: AC | PRN
  Administered 2013-09-16: 2 mg
  Filled 2013-09-16: qty 2

## 2013-09-16 MED ORDER — LEUCOVORIN CALCIUM INJECTION 350 MG
389.0000 mg/m2 | Freq: Once | INTRAVENOUS | Status: AC
  Administered 2013-09-16: 750 mg via INTRAVENOUS
  Filled 2013-09-16: qty 37.5

## 2013-09-16 MED ORDER — FLUOROURACIL CHEMO INJECTION 2.5 GM/50ML
400.0000 mg/m2 | Freq: Once | INTRAVENOUS | Status: AC
  Administered 2013-09-16: 750 mg via INTRAVENOUS
  Filled 2013-09-16: qty 15

## 2013-09-16 MED ORDER — DEXTROSE 5 % IV SOLN: Freq: Once | INTRAVENOUS | Status: DC

## 2013-09-16 MED ORDER — HEPARIN SOD (PORK) LOCK FLUSH 100 UNIT/ML IV SOLN
500.0000 [IU] | Freq: Once | INTRAVENOUS | Status: DC | PRN
  Filled 2013-09-16: qty 5

## 2013-09-16 MED ORDER — SODIUM CHLORIDE 0.9 % IV SOLN
2400.0000 mg/m2 | INTRAVENOUS | Status: DC
  Administered 2013-09-16: 4650 mg via INTRAVENOUS
  Filled 2013-09-16: qty 93

## 2013-09-16 MED ORDER — SODIUM CHLORIDE 0.9 % IJ SOLN
10.0000 mL | INTRAMUSCULAR | Status: DC | PRN
  Filled 2013-09-16: qty 10

## 2013-09-16 NOTE — Progress Notes (Signed)
OFFICE PROGRESS NOTE  Interval history:  Tony Barajas returns for followup of metastatic colon cancer. He completed cycle 8 FOLFOX 09/02/2013. He denies nausea/vomiting. No mouth sores. No diarrhea. Cold sensitivity lasted 5 days initially. He noted mild numbness/tingling in his hands this morning with cold exposure. He reports a decreased appetite and weight loss. No consistent abdominal pain.   Objective: Blood pressure 95/71, pulse 124, temperature 97 F (36.1 C), temperature source Oral, resp. rate 18, height 5\' 10"  (1.778 m), weight 144 lb 6.4 oz (65.499 kg).  Oropharynx is without thrush or ulceration. Lungs are clear. Regular cardiac rhythm. Port-A-Cath site is without erythema. Abdomen is soft. No hepatomegaly. Tender at the right mid abdomen. No mass. No leg edema. Calves are nontender. Minimal decrease in vibratory sense at the fingertips.  Lab Results: Lab Results  Component Value Date   WBC 5.0 09/16/2013   HGB 10.6* 09/16/2013   HCT 33.4* 09/16/2013   MCV 97.9 09/16/2013   PLT 261 09/16/2013    Chemistry:    Chemistry      Component Value Date/Time   NA 133* 09/02/2013 0947   NA 139 03/14/2013 1023   K 3.9 09/02/2013 0947   K 4.0 03/14/2013 1023   CL 104 03/14/2013 1023   CO2 19* 09/02/2013 0947   CO2 22 03/14/2013 1023   BUN 9.4 09/02/2013 0947   BUN 10 03/14/2013 1023   CREATININE 0.6* 09/02/2013 0947   CREATININE 0.7 03/14/2013 1023      Component Value Date/Time   CALCIUM 8.5 09/02/2013 0947   CALCIUM 9.1 03/14/2013 1023   ALKPHOS 187* 09/02/2013 0947   ALKPHOS 82 03/14/2013 1023   AST 53* 09/02/2013 0947   AST 33 03/14/2013 1023   ALT 22 09/02/2013 0947   ALT 26 03/14/2013 1023   BILITOT 0.49 09/02/2013 0947   BILITOT 0.5 03/14/2013 1023       Studies/Results: No results found.  Medications: I have reviewed the patient's current medications.  Assessment/Plan:  1. Hepatic flexure mass with extensive liver metastases. Status post a colonoscopic biopsy on  04/14/2013 confirming at least high-grade glandular dysplasia.  Status post liver biopsy 05/05/2013 confirming metastatic colorectal adenocarcinoma.  Cycle 1 FOLFOX 05/14/2013.  Cycle 2 FOLFOX 05/27/2013.  Cycle 3 FOLFOX 06/10/2013.  Cycle 4 FOLFOX 06/24/2013.  Cycle 5 FOLFOX 07/15/2013.  Restaging CT evaluation 07/25/2013 showed some liver lesions were stable while others had increased in size. There was a generalized decrease in abdominal lymphadenopathy.  Cycle 6 of FOLFOX 07/29/2013.  Cycle 7 of FOLFOX 08/12/2013. Cycle 8 FOLFOX 09/02/2013. 2. Microcytic anemia secondary to #1. He was transfused with PRBCs on 04/30/2013 when the hemoglobin returned at 6.5. He was also given a dose of IV iron. He continues oral iron.  3. Hepatitis C positive. 4. Weight loss.  5. COPD. 6. Tobacco use. 7. History of alcohol use. 8. Nausea following cycle 7 FOLFOX-prolonged period, he reports relief of the nausea with Compazine. We will add Aloxi today.  Disposition-he has completed 8 cycles of FOLFOX. Oxaliplatin was dose reduced with cycle 8 due to prolonged cold sensitivity.  Performance status continues to be decreased. He is losing weight and has lost his appetite. We will hold the oxaliplatin with today's treatment.  Plan to proceed with cycle 9 FOLFOX today as scheduled without oxaliplatin. He is scheduled for restaging CT scans and a followup visit on 09/29/2013. He will contact the office in the interim with any problems.  Plan reviewed with Dr. Truett Perna.  Tony Fus,  Tony Barajas ANP/GNP-BC

## 2013-09-16 NOTE — Telephone Encounter (Signed)
gv pt appt schedule for december and january. °

## 2013-09-16 NOTE — Progress Notes (Signed)
Patient is angry that he has lost weight.  He states the chemotherapy affects his appetite and his ability to consume increased calories and protein, leading to his weight loss.  Patient's weight was documented as 144.4 pounds December 16, down from 151 pounds December 2.  Patient reports that physician is holding oxaliplatin.  Patient continues to express how he likes to eat and this has really affected his quality-of-life.  Nutrition diagnosis: Unintentional weight loss continues.  Intervention: I listened while patient expressed his feelings.  I provided support and encouragement that patient's appetite should pick up and that perhaps he will be able to consume higher calories and protein over the next few weeks.  Patient agrees to work on increasing oral intake.  Monitoring, evaluation, goals: Patient will work to increase calories and protein, including Ensure Plus as needed, to minimize further weight loss.  Next visit: Tuesday, January 13 during chemotherapy.

## 2013-09-18 ENCOUNTER — Ambulatory Visit (HOSPITAL_BASED_OUTPATIENT_CLINIC_OR_DEPARTMENT_OTHER): Payer: 59

## 2013-09-18 VITALS — BP 124/94 | HR 110 | Temp 97.8°F | Resp 18

## 2013-09-18 DIAGNOSIS — C189 Malignant neoplasm of colon, unspecified: Secondary | ICD-10-CM

## 2013-09-18 DIAGNOSIS — C787 Secondary malignant neoplasm of liver and intrahepatic bile duct: Secondary | ICD-10-CM

## 2013-09-18 MED ORDER — HEPARIN SOD (PORK) LOCK FLUSH 100 UNIT/ML IV SOLN
500.0000 [IU] | Freq: Once | INTRAVENOUS | Status: AC | PRN
  Administered 2013-09-18: 500 [IU]
  Filled 2013-09-18: qty 5

## 2013-09-18 MED ORDER — SODIUM CHLORIDE 0.9 % IJ SOLN
10.0000 mL | INTRAMUSCULAR | Status: DC | PRN
  Administered 2013-09-18: 10 mL
  Filled 2013-09-18: qty 10

## 2013-09-18 NOTE — Patient Instructions (Signed)

## 2013-09-27 ENCOUNTER — Other Ambulatory Visit: Payer: Self-pay | Admitting: Oncology

## 2013-09-29 ENCOUNTER — Ambulatory Visit (HOSPITAL_COMMUNITY)
Admission: RE | Admit: 2013-09-29 | Discharge: 2013-09-29 | Disposition: A | Discharge status: Home / Self Care | Payer: 59 | Source: Ambulatory Visit | Attending: Oncology | Admitting: Oncology

## 2013-09-29 ENCOUNTER — Ambulatory Visit (HOSPITAL_BASED_OUTPATIENT_CLINIC_OR_DEPARTMENT_OTHER): Payer: 59

## 2013-09-29 ENCOUNTER — Other Ambulatory Visit (HOSPITAL_BASED_OUTPATIENT_CLINIC_OR_DEPARTMENT_OTHER): Payer: 59

## 2013-09-29 ENCOUNTER — Other Ambulatory Visit: Payer: Self-pay | Admitting: Oncology

## 2013-09-29 ENCOUNTER — Encounter (HOSPITAL_COMMUNITY): Payer: Self-pay

## 2013-09-29 ENCOUNTER — Ambulatory Visit (HOSPITAL_BASED_OUTPATIENT_CLINIC_OR_DEPARTMENT_OTHER): Payer: 59 | Admitting: Oncology

## 2013-09-29 ENCOUNTER — Telehealth: Payer: Self-pay | Admitting: Oncology

## 2013-09-29 VITALS — BP 116/70 | HR 110 | Temp 97.0°F | Resp 18 | Ht 70.0 in | Wt 148.2 lb

## 2013-09-29 VITALS — BP 118/69 | HR 86 | Temp 97.5°F

## 2013-09-29 DIAGNOSIS — D509 Iron deficiency anemia, unspecified: Secondary | ICD-10-CM

## 2013-09-29 DIAGNOSIS — R109 Unspecified abdominal pain: Secondary | ICD-10-CM | POA: Insufficient documentation

## 2013-09-29 DIAGNOSIS — K439 Ventral hernia without obstruction or gangrene: Secondary | ICD-10-CM | POA: Insufficient documentation

## 2013-09-29 DIAGNOSIS — C183 Malignant neoplasm of hepatic flexure: Secondary | ICD-10-CM

## 2013-09-29 DIAGNOSIS — K55059 Acute (reversible) ischemia of intestine, part and extent unspecified: Secondary | ICD-10-CM | POA: Insufficient documentation

## 2013-09-29 DIAGNOSIS — C189 Malignant neoplasm of colon, unspecified: Secondary | ICD-10-CM

## 2013-09-29 DIAGNOSIS — N4289 Other specified disorders of prostate: Secondary | ICD-10-CM | POA: Insufficient documentation

## 2013-09-29 DIAGNOSIS — R599 Enlarged lymph nodes, unspecified: Secondary | ICD-10-CM | POA: Insufficient documentation

## 2013-09-29 DIAGNOSIS — C787 Secondary malignant neoplasm of liver and intrahepatic bile duct: Secondary | ICD-10-CM

## 2013-09-29 DIAGNOSIS — R188 Other ascites: Secondary | ICD-10-CM | POA: Insufficient documentation

## 2013-09-29 DIAGNOSIS — K6389 Other specified diseases of intestine: Secondary | ICD-10-CM | POA: Insufficient documentation

## 2013-09-29 DIAGNOSIS — R161 Splenomegaly, not elsewhere classified: Secondary | ICD-10-CM | POA: Insufficient documentation

## 2013-09-29 DIAGNOSIS — Z5111 Encounter for antineoplastic chemotherapy: Secondary | ICD-10-CM

## 2013-09-29 DIAGNOSIS — Z79899 Other long term (current) drug therapy: Secondary | ICD-10-CM | POA: Insufficient documentation

## 2013-09-29 DIAGNOSIS — R5381 Other malaise: Secondary | ICD-10-CM | POA: Insufficient documentation

## 2013-09-29 DIAGNOSIS — K7689 Other specified diseases of liver: Secondary | ICD-10-CM | POA: Insufficient documentation

## 2013-09-29 LAB — CBC WITH DIFFERENTIAL/PLATELET
Basophils Absolute: 0 10*3/uL (ref 0.0–0.1)
EOS%: 1.9 % (ref 0.0–7.0)
Eosinophils Absolute: 0.2 10*3/uL (ref 0.0–0.5)
HGB: 11 g/dL — ABNORMAL LOW (ref 13.0–17.1)
LYMPH%: 18.9 % (ref 14.0–49.0)
MCH: 31.5 pg (ref 27.2–33.4)
MCHC: 32.8 g/dL (ref 32.0–36.0)
MONO#: 1.1 10*3/uL — ABNORMAL HIGH (ref 0.1–0.9)
MONO%: 12.9 % (ref 0.0–14.0)
NEUT%: 66.1 % (ref 39.0–75.0)
RDW: 17.3 % — ABNORMAL HIGH (ref 11.0–14.6)
WBC: 8.4 10*3/uL (ref 4.0–10.3)
lymph#: 1.6 10*3/uL (ref 0.9–3.3)

## 2013-09-29 LAB — COMPREHENSIVE METABOLIC PANEL (CC13)
AST: 27 U/L (ref 5–34)
Alkaline Phosphatase: 130 U/L (ref 40–150)
Anion Gap: 9 mEq/L (ref 3–11)
BUN: 7.9 mg/dL (ref 7.0–26.0)
CO2: 21 mEq/L — ABNORMAL LOW (ref 22–29)
Creatinine: 0.6 mg/dL — ABNORMAL LOW (ref 0.7–1.3)
Glucose: 86 mg/dl (ref 70–140)
Sodium: 137 mEq/L (ref 136–145)
Total Bilirubin: 0.52 mg/dL (ref 0.20–1.20)

## 2013-09-29 MED ORDER — DEXAMETHASONE SODIUM PHOSPHATE 20 MG/5ML IJ SOLN
INTRAMUSCULAR | Status: AC
  Filled 2013-09-29: qty 5

## 2013-09-29 MED ORDER — IRINOTECAN HCL CHEMO INJECTION 100 MG/5ML
175.8000 mg/m2 | Freq: Once | INTRAVENOUS | Status: AC
  Administered 2013-09-29: 320 mg via INTRAVENOUS
  Filled 2013-09-29: qty 16

## 2013-09-29 MED ORDER — PALONOSETRON HCL INJECTION 0.25 MG/5ML
INTRAVENOUS | Status: AC
  Filled 2013-09-29: qty 5

## 2013-09-29 MED ORDER — SODIUM CHLORIDE 0.9 % IV SOLN
Freq: Once | INTRAVENOUS | Status: AC
  Administered 2013-09-29: 14:00:00 via INTRAVENOUS

## 2013-09-29 MED ORDER — ATROPINE SULFATE 1 MG/ML IJ SOLN
0.5000 mg | Freq: Once | INTRAMUSCULAR | Status: AC | PRN
  Administered 2013-09-29: 0.5 mg via INTRAVENOUS

## 2013-09-29 MED ORDER — PALONOSETRON HCL INJECTION 0.25 MG/5ML
0.2500 mg | Freq: Once | INTRAVENOUS | Status: AC
  Administered 2013-09-29: 0.25 mg via INTRAVENOUS

## 2013-09-29 MED ORDER — LEUCOVORIN CALCIUM INJECTION 350 MG
412.0000 mg/m2 | Freq: Once | INTRAVENOUS | Status: AC
  Administered 2013-09-29: 750 mg via INTRAVENOUS
  Filled 2013-09-29: qty 37.5

## 2013-09-29 MED ORDER — FLUOROURACIL CHEMO INJECTION 2.5 GM/50ML
400.0000 mg/m2 | Freq: Once | INTRAVENOUS | Status: AC
  Administered 2013-09-29: 750 mg via INTRAVENOUS
  Filled 2013-09-29: qty 15

## 2013-09-29 MED ORDER — OXYCODONE-ACETAMINOPHEN 5-325 MG PO TABS: 1.0000 | ORAL_TABLET | ORAL | Status: DC | PRN

## 2013-09-29 MED ORDER — ATROPINE SULFATE 1 MG/ML IJ SOLN
INTRAMUSCULAR | Status: AC
  Filled 2013-09-29: qty 1

## 2013-09-29 MED ORDER — IOHEXOL 300 MG/ML  SOLN
100.0000 mL | Freq: Once | INTRAMUSCULAR | Status: AC | PRN
  Administered 2013-09-29: 80 mL via INTRAVENOUS

## 2013-09-29 MED ORDER — DEXAMETHASONE SODIUM PHOSPHATE 20 MG/5ML IJ SOLN
20.0000 mg | Freq: Once | INTRAMUSCULAR | Status: AC
  Administered 2013-09-29: 20 mg via INTRAVENOUS

## 2013-09-29 MED ORDER — SODIUM CHLORIDE 0.9 % IV SOLN
2400.0000 mg/m2 | INTRAVENOUS | Status: DC
  Administered 2013-09-29: 4350 mg via INTRAVENOUS
  Filled 2013-09-29: qty 87

## 2013-09-29 NOTE — Progress Notes (Signed)
[facility]    OFFICE PROGRESS NOTE   INTERVAL HISTORY:   He completed a cycle of 5-fluorouracil on 09/16/2013. No nausea, mouth sores, or diarrhea following chemotherapy. No neuropathy symptoms. He continues to have abdominal pain that is worse after eating. The pain is no longer relieved with tramadol. Good appetite. No bleeding.  Objective:  Vital signs in last 24 hours:  Blood pressure 116/70, pulse 110, temperature 97 F (36.1 C), temperature source Oral, resp. rate 18, height 5\' 10"  (1.778 m), weight 148 lb 3.2 oz (67.223 kg).    HEENT: No thrush or ulcers Lymphatics: No cervical, supraclavicular, axillary, or inguinal nodes Resp: Lungs clear bilaterally Cardio: Regular rate and rhythm GI: Mild fullness in the right upper quadrant without a discrete liver edge. No apparent ascites. Tender in the mid abdomen bilaterally. No mass. Vascular: No leg edema    Portacath/PICC-without erythema  Lab Results:  Lab Results  Component Value Date   WBC 8.4 09/29/2013   HGB 11.0* 09/29/2013   HCT 33.5* 09/29/2013   MCV 96.0 09/29/2013   PLT 191 09/29/2013   NEUTROABS 5.6 09/29/2013    X-rays: CT abdomen and pelvis 09/29/2013, compared to 07/25/2013: Unchanged liver lesions, mass at the hepatic flexure , stable superior mesenteric vein thrombosis, stable adenopathy, increased wall thickening at the terminal ileum  Medications: I have reviewed the patient's current medications.  Assessment/Plan: 1. Hepatic flexure mass with extensive liver metastases. Status post a colonoscopic biopsy on 04/14/2013 confirming at least high-grade glandular dysplasia.  Status post liver biopsy 05/05/2013 confirming metastatic colorectal adenocarcinoma.  Cycle 1 FOLFOX 05/14/2013.  Cycle 2 FOLFOX 05/27/2013.  Cycle 3 FOLFOX 06/10/2013.  Cycle 4 FOLFOX 06/24/2013.  Cycle 5 FOLFOX 07/15/2013.  Restaging CT evaluation 07/25/2013 showed some liver lesions were stable while others  had increased in size. There was a generalized decrease in abdominal lymphadenopathy.  Cycle 6 of FOLFOX 07/29/2013.  Cycle 7 of FOLFOX 08/12/2013.  Cycle 8 FOLFOX 09/02/2013. Cycle 9 FOLFOX (oxaliplatin held) 09/16/2013 CT 09/29/2013 with stable liver metastases, hepatic flexure mass, and adenopathy. Increased thickening at the ileum with surrounding ascites 2. Microcytic anemia secondary to #1. He was transfused with PRBCs on 04/30/2013 when the hemoglobin returned at 6.5. He was also given a dose of IV iron. He continues oral iron.  3. Hepatitis C positive. 4. Weight loss.  5. COPD. 6. Tobacco use. 7. History of alcohol use. 8. Nausea following cycle 7 FOLFOX-prolonged period, he reports relief of the nausea with Compazine. We will add Aloxi today. 9. Abdominal pain-progressive, potentially related to the liver metastases, colon mass, or inflammation at the terminal ileum. The terminal ileum inflammation could be related to vascular compromise  Disposition:  I discussed the restaging CT with Mr. Born. He has completed 9 cycles of chemotherapy. He continues to have pain and  the metastatic tumor burden has not diminished. We decided to make a change to FOLFIRI. I reviewed the potential toxicities associated with irinotecan including a chance for nausea/vomiting, abdominal pain, diarrhea, hematologic toxicity, mucositis, and alopecia. He agrees to proceed. We will ask for K-ras testing on the liver biopsy if this has not been done and consider adding EGFR therapy if he is K-ras wild-type. He received reading materials on irinotecan.  He will contact us for increased pain. He reports the character of the pain has not changed since he started therapy, but the pain is now in the bilateral abdomen as opposed to "right sided ". He will contact  us for increased pain or abdominal distention. He was given a prescription for oxycodone to use as needed for pain.  Mr. Si will return for an  office visit and chemotherapy in 2 weeks.  Approximately 40 minutes were spent with patient today. The the majority of the time was used for counseling and coordination of care.   Thornton Papas, MD  09/29/2013  1:00 PM

## 2013-09-29 NOTE — Progress Notes (Signed)
1445 Patient complains of nausea. Irinotecan, Leucovorin on hold, Atropine given. VSS.

## 2013-09-29 NOTE — Telephone Encounter (Signed)
gv and printed appt sched and avs for pt for Jan 2015  sed added tx. °

## 2013-09-29 NOTE — Progress Notes (Signed)
Provided teaching on Camptosar and how to take OTC  Imodium for diarrhea and when to call office using Teach Back method. Printed handout from [employer] and printed Imodium dosing directions.

## 2013-09-29 NOTE — Patient Instructions (Addendum)
[facility] Discharge Instructions for Patients Receiving Chemotherapy  Today you received the following chemotherapy agents: 5 FU/Leucovorin/Irinotecan  To help prevent nausea and vomiting after your treatment, we encourage you to take your nausea medication as prescribed.  If you develop nausea and vomiting that is not controlled by your nausea medication, call the clinic.   BELOW ARE SYMPTOMS THAT SHOULD BE REPORTED IMMEDIATELY:  *FEVER GREATER THAN 100.5 F  *CHILLS WITH OR WITHOUT FEVER  NAUSEA AND VOMITING THAT IS NOT CONTROLLED WITH YOUR NAUSEA MEDICATION  *UNUSUAL SHORTNESS OF BREATH  *UNUSUAL BRUISING OR BLEEDING  TENDERNESS IN MOUTH AND THROAT WITH OR WITHOUT PRESENCE OF ULCERS  *URINARY PROBLEMS  *BOWEL PROBLEMS  UNUSUAL RASH Items with * indicate a potential emergency and should be followed up as soon as possible.  Feel free to call the clinic you have any questions or concerns. The clinic phone number is [phone].    Irinotecan injection What is this medicine? IRINOTECAN (ir in oh TEE kan ) is a chemotherapy drug. It is used to treat colon and rectal cancer. This medicine may be used for other purposes; ask your health care provider or pharmacist if you have questions. COMMON BRAND NAME(S): Camptosar What should I tell my health care provider before I take this medicine? They need to know if you have any of these conditions: -blood disorders -dehydration -diarrhea -infection (especially a virus infection such as chickenpox, cold sores, or herpes) -liver disease -low blood counts, like low white cell, platelet, or red cell counts -recent or ongoing radiation therapy -an unusual or allergic reaction to irinotecan, sorbitol, other chemotherapy, other medicines, foods, dyes, or preservatives -pregnant or trying to get pregnant -breast-feeding How should I use this medicine? This drug is given as an infusion into a vein. It is administered  in a hospital or clinic by a specially trained health care professional. Talk to your pediatrician regarding the use of this medicine in children. Special care may be needed. Overdosage: If you think you have taken too much of this medicine contact a poison control center or emergency room at once. NOTE: This medicine is only for you. Do not share this medicine with others. What if I miss a dose? It is important not to miss your dose. Call your doctor or health care professional if you are unable to keep an appointment. What may interact with this medicine? Do not take this medicine with any of the following medications: -atazanavir -ketoconazole -St. John's Wort This medicine may also interact with the following medications: -dexamethasone -diuretics -laxatives -medicines for seizures like carbamazepine, mephobarbital, phenobarbital, phenytoin, primidone -medicines to increase blood counts like filgrastim, pegfilgrastim, sargramostim -prochlorperazine -vaccines This list may not describe all possible interactions. Give your health care provider a list of all the medicines, herbs, non-prescription drugs, or dietary supplements you use. Also tell them if you smoke, drink alcohol, or use illegal drugs. Some items may interact with your medicine. What should I watch for while using this medicine? Your condition will be monitored carefully while you are receiving this medicine. You will need important blood work done while you are taking this medicine. This drug may make you feel generally unwell. This is not uncommon, as chemotherapy can affect healthy cells as well as cancer cells. Report any side effects. Continue your course of treatment even though you feel ill unless your doctor tells you to stop. In some cases, you may be given additional medicines to help with side effects. Follow  all directions for their use. You may get drowsy or dizzy. Do not drive, use machinery, or do anything that  needs mental alertness until you know how this medicine affects you. Do not stand or sit up quickly, especially if you are an older patient. This reduces the risk of dizzy or fainting spells. Call your doctor or health care professional for advice if you get a fever, chills or sore throat, or other symptoms of a cold or flu. Do not treat yourself. This drug decreases your body's ability to fight infections. Try to avoid being around people who are sick. This medicine may increase your risk to bruise or bleed. Call your doctor or health care professional if you notice any unusual bleeding. Be careful brushing and flossing your teeth or using a toothpick because you may get an infection or bleed more easily. If you have any dental work done, tell your dentist you are receiving this medicine. Avoid taking products that contain aspirin, acetaminophen, ibuprofen, naproxen, or ketoprofen unless instructed by your doctor. These medicines may hide a fever. Do not become pregnant while taking this medicine. Women should inform their doctor if they wish to become pregnant or think they might be pregnant. There is a potential for serious side effects to an unborn child. Talk to your health care professional or pharmacist for more information. Do not breast-feed an infant while taking this medicine. What side effects may I notice from receiving this medicine? Side effects that you should report to your doctor or health care professional as soon as possible: -allergic reactions like skin rash, itching or hives, swelling of the face, lips, or tongue -low blood counts - this medicine may decrease the number of white blood cells, red blood cells and platelets. You may be at increased risk for infections and bleeding. -signs of infection - fever or chills, cough, sore throat, pain or difficulty passing urine -signs of decreased platelets or bleeding - bruising, pinpoint red spots on the skin, black, tarry stools, blood in  the urine -signs of decreased red blood cells - unusually weak or tired, fainting spells, lightheadedness -breathing problems -chest pain -diarrhea -feeling faint or lightheaded, falls -flushing, runny nose, sweating during infusion -mouth sores or pain -pain, swelling, redness or irritation where injected -pain, swelling, warmth in the leg -pain, tingling, numbness in the hands or feet -problems with balance, talking, walking -stomach cramps, pain -trouble passing urine or change in the amount of urine -vomiting as to be unable to hold down drinks or food -yellowing of the eyes or skin Side effects that usually do not require medical attention (report to your doctor or health care professional if they continue or are bothersome): -constipation -hair loss -headache -loss of appetite -nausea, vomiting -stomach upset This list may not describe all possible side effects. Call your doctor for medical advice about side effects. You may report side effects to FDA at 1-800-FDA-1088. Where should I keep my medicine? This drug is given in a hospital or clinic and will not be stored at home. NOTE: This sheet is a summary. It may not cover all possible information. If you have questions about this medicine, talk to your doctor, pharmacist, or health care provider.  2014, Elsevier/Gold Standard. (2008-02-04 16:29:12)

## 2013-09-30 ENCOUNTER — Telehealth: Payer: Self-pay | Admitting: *Deleted

## 2013-09-30 NOTE — Telephone Encounter (Signed)
A standard KRAS testing of liver biopsy, accession # [id number] was made to pathology per request of Dr. Truett Perna.  Notified J. Epps and K. Jarold Motto in pathology.

## 2013-10-01 ENCOUNTER — Ambulatory Visit (HOSPITAL_BASED_OUTPATIENT_CLINIC_OR_DEPARTMENT_OTHER): Payer: 59

## 2013-10-01 VITALS — BP 111/68 | HR 101 | Temp 97.4°F

## 2013-10-01 DIAGNOSIS — C189 Malignant neoplasm of colon, unspecified: Secondary | ICD-10-CM

## 2013-10-01 DIAGNOSIS — C787 Secondary malignant neoplasm of liver and intrahepatic bile duct: Secondary | ICD-10-CM

## 2013-10-01 MED ORDER — HEPARIN SOD (PORK) LOCK FLUSH 100 UNIT/ML IV SOLN
500.0000 [IU] | Freq: Once | INTRAVENOUS | Status: AC | PRN
  Administered 2013-10-01: 500 [IU]
  Filled 2013-10-01: qty 5

## 2013-10-01 MED ORDER — SODIUM CHLORIDE 0.9 % IJ SOLN
10.0000 mL | INTRAMUSCULAR | Status: DC | PRN
Start: 2013-10-01 — End: 2013-10-01
  Administered 2013-10-01: 10 mL
  Filled 2013-10-01: qty 10

## 2013-10-09 ENCOUNTER — Encounter (HOSPITAL_COMMUNITY): Payer: Self-pay

## 2013-10-10 ENCOUNTER — Telehealth: Payer: Self-pay | Admitting: *Deleted

## 2013-10-10 NOTE — Telephone Encounter (Signed)
Per order from Dr. Sherrill, Extended KRAS testing request was made to pathology.  Spoke with Kim Patterson by phone and submitted forms via fax to pathology to complete and send to [facility]. 

## 2013-10-12 ENCOUNTER — Other Ambulatory Visit: Payer: Self-pay | Admitting: Oncology

## 2013-10-14 ENCOUNTER — Other Ambulatory Visit (HOSPITAL_COMMUNITY)
Admission: RE | Admit: 2013-10-14 | Discharge: 2013-10-14 | Disposition: A | Discharge status: Home / Self Care | Payer: 59 | Source: Ambulatory Visit | Attending: Oncology | Admitting: Oncology

## 2013-10-14 ENCOUNTER — Ambulatory Visit: Payer: 59 | Admitting: Nutrition

## 2013-10-14 ENCOUNTER — Ambulatory Visit (HOSPITAL_BASED_OUTPATIENT_CLINIC_OR_DEPARTMENT_OTHER): Payer: 59 | Admitting: Oncology

## 2013-10-14 ENCOUNTER — Ambulatory Visit (HOSPITAL_BASED_OUTPATIENT_CLINIC_OR_DEPARTMENT_OTHER): Payer: 59

## 2013-10-14 ENCOUNTER — Telehealth: Payer: Self-pay | Admitting: Oncology

## 2013-10-14 ENCOUNTER — Other Ambulatory Visit: Payer: Self-pay | Admitting: *Deleted

## 2013-10-14 ENCOUNTER — Other Ambulatory Visit (HOSPITAL_BASED_OUTPATIENT_CLINIC_OR_DEPARTMENT_OTHER): Payer: 59

## 2013-10-14 VITALS — BP 93/78 | HR 128 | Temp 97.9°F | Resp 20 | Ht 70.0 in | Wt 147.7 lb

## 2013-10-14 DIAGNOSIS — C787 Secondary malignant neoplasm of liver and intrahepatic bile duct: Secondary | ICD-10-CM

## 2013-10-14 DIAGNOSIS — B1920 Unspecified viral hepatitis C without hepatic coma: Secondary | ICD-10-CM

## 2013-10-14 DIAGNOSIS — C189 Malignant neoplasm of colon, unspecified: Secondary | ICD-10-CM

## 2013-10-14 DIAGNOSIS — R634 Abnormal weight loss: Secondary | ICD-10-CM

## 2013-10-14 DIAGNOSIS — C183 Malignant neoplasm of hepatic flexure: Secondary | ICD-10-CM

## 2013-10-14 DIAGNOSIS — D509 Iron deficiency anemia, unspecified: Secondary | ICD-10-CM

## 2013-10-14 DIAGNOSIS — J4489 Other specified chronic obstructive pulmonary disease: Secondary | ICD-10-CM

## 2013-10-14 DIAGNOSIS — G4700 Insomnia, unspecified: Secondary | ICD-10-CM

## 2013-10-14 DIAGNOSIS — F17200 Nicotine dependence, unspecified, uncomplicated: Secondary | ICD-10-CM

## 2013-10-14 DIAGNOSIS — Z5111 Encounter for antineoplastic chemotherapy: Secondary | ICD-10-CM

## 2013-10-14 DIAGNOSIS — J449 Chronic obstructive pulmonary disease, unspecified: Secondary | ICD-10-CM

## 2013-10-14 DIAGNOSIS — C19 Malignant neoplasm of rectosigmoid junction: Secondary | ICD-10-CM

## 2013-10-14 DIAGNOSIS — R109 Unspecified abdominal pain: Secondary | ICD-10-CM

## 2013-10-14 LAB — CBC WITH DIFFERENTIAL/PLATELET
BASO%: 0.4 % (ref 0.0–2.0)
Basophils Absolute: 0 10*3/uL (ref 0.0–0.1)
EOS%: 2.7 % (ref 0.0–7.0)
Eosinophils Absolute: 0.2 10*3/uL (ref 0.0–0.5)
HCT: 33.6 % — ABNORMAL LOW (ref 38.4–49.9)
HGB: 10.8 g/dL — ABNORMAL LOW (ref 13.0–17.1)
LYMPH%: 25.7 % (ref 14.0–49.0)
MCH: 31.6 pg (ref 27.2–33.4)
MCHC: 32.1 g/dL (ref 32.0–36.0)
MCV: 98.2 fL — AB (ref 79.3–98.0)
MONO#: 0.9 10*3/uL (ref 0.1–0.9)
MONO%: 15.9 % — AB (ref 0.0–14.0)
NEUT#: 3.1 10*3/uL (ref 1.5–6.5)
NEUT%: 55.3 % (ref 39.0–75.0)
NRBC: 0 % (ref 0–0)
Platelets: 275 10*3/uL (ref 140–400)
RBC: 3.42 10*6/uL — ABNORMAL LOW (ref 4.20–5.82)
RDW: 16.8 % — AB (ref 11.0–14.6)
WBC: 5.7 10*3/uL (ref 4.0–10.3)
lymph#: 1.5 10*3/uL (ref 0.9–3.3)

## 2013-10-14 LAB — COMPREHENSIVE METABOLIC PANEL (CC13)
ALK PHOS: 128 U/L (ref 40–150)
ALT: 11 U/L (ref 0–55)
AST: 28 U/L (ref 5–34)
Albumin: 2.5 g/dL — ABNORMAL LOW (ref 3.5–5.0)
Anion Gap: 10 mEq/L (ref 3–11)
BILIRUBIN TOTAL: 0.4 mg/dL (ref 0.20–1.20)
BUN: 10.1 mg/dL (ref 7.0–26.0)
CO2: 18 mEq/L — ABNORMAL LOW (ref 22–29)
Calcium: 8.5 mg/dL (ref 8.4–10.4)
Chloride: 109 mEq/L (ref 98–109)
Creatinine: 0.6 mg/dL — ABNORMAL LOW (ref 0.7–1.3)
Glucose: 87 mg/dl (ref 70–140)
Potassium: 3.9 mEq/L (ref 3.5–5.1)
SODIUM: 138 meq/L (ref 136–145)
TOTAL PROTEIN: 7.4 g/dL (ref 6.4–8.3)

## 2013-10-14 MED ORDER — PALONOSETRON HCL INJECTION 0.25 MG/5ML
0.2500 mg | Freq: Once | INTRAVENOUS | Status: AC
  Administered 2013-10-14: 0.25 mg via INTRAVENOUS

## 2013-10-14 MED ORDER — FLUOROURACIL CHEMO INJECTION 2.5 GM/50ML
400.0000 mg/m2 | Freq: Once | INTRAVENOUS | Status: AC
  Administered 2013-10-14: 750 mg via INTRAVENOUS
  Filled 2013-10-14: qty 15

## 2013-10-14 MED ORDER — SODIUM CHLORIDE 0.9 % IV SOLN
Freq: Once | INTRAVENOUS | Status: AC
  Administered 2013-10-14: 10:00:00 via INTRAVENOUS

## 2013-10-14 MED ORDER — LIDOCAINE-PRILOCAINE 2.5-2.5 % EX CREA: TOPICAL_CREAM | CUTANEOUS | Status: AC | PRN

## 2013-10-14 MED ORDER — PALONOSETRON HCL INJECTION 0.25 MG/5ML
INTRAVENOUS | Status: AC
  Filled 2013-10-14: qty 5

## 2013-10-14 MED ORDER — LEUCOVORIN CALCIUM INJECTION 350 MG
412.0000 mg/m2 | Freq: Once | INTRAVENOUS | Status: AC
  Administered 2013-10-14: 750 mg via INTRAVENOUS
  Filled 2013-10-14: qty 37.5

## 2013-10-14 MED ORDER — ATROPINE SULFATE 1 MG/ML IJ SOLN
INTRAMUSCULAR | Status: AC
  Filled 2013-10-14: qty 1

## 2013-10-14 MED ORDER — IRINOTECAN HCL CHEMO INJECTION 100 MG/5ML
176.0000 mg/m2 | Freq: Once | INTRAVENOUS | Status: AC
  Administered 2013-10-14: 320 mg via INTRAVENOUS
  Filled 2013-10-14: qty 16

## 2013-10-14 MED ORDER — SODIUM CHLORIDE 0.9 % IV SOLN
2400.0000 mg/m2 | INTRAVENOUS | Status: DC
  Administered 2013-10-14: 4350 mg via INTRAVENOUS
  Filled 2013-10-14: qty 87

## 2013-10-14 MED ORDER — ATROPINE SULFATE 1 MG/ML IJ SOLN
0.5000 mg | Freq: Once | INTRAMUSCULAR | Status: AC | PRN
  Administered 2013-10-14: 0.5 mg via INTRAVENOUS

## 2013-10-14 MED ORDER — DEXAMETHASONE SODIUM PHOSPHATE 20 MG/5ML IJ SOLN
20.0000 mg | Freq: Once | INTRAMUSCULAR | Status: AC
  Administered 2013-10-14: 20 mg via INTRAVENOUS

## 2013-10-14 MED ORDER — ZOLPIDEM TARTRATE 5 MG PO TABS: 5.0000 mg | ORAL_TABLET | Freq: Every evening | ORAL | Status: DC | PRN

## 2013-10-14 MED ORDER — DEXAMETHASONE SODIUM PHOSPHATE 20 MG/5ML IJ SOLN
INTRAMUSCULAR | Status: AC
  Filled 2013-10-14: qty 5

## 2013-10-14 NOTE — Patient Instructions (Signed)
[facility] Discharge Instructions for Patients Receiving Chemotherapy  Today you received the following chemotherapy agents 5 FU/Leucovorin/Irinotecan To help prevent nausea and vomiting after your treatment, we encourage you to take your nausea medication as prescribed.  If you develop nausea and vomiting that is not controlled by your nausea medication, call the clinic.   BELOW ARE SYMPTOMS THAT SHOULD BE REPORTED IMMEDIATELY:  *FEVER GREATER THAN 100.5 F  *CHILLS WITH OR WITHOUT FEVER  NAUSEA AND VOMITING THAT IS NOT CONTROLLED WITH YOUR NAUSEA MEDICATION  *UNUSUAL SHORTNESS OF BREATH  *UNUSUAL BRUISING OR BLEEDING  TENDERNESS IN MOUTH AND THROAT WITH OR WITHOUT PRESENCE OF ULCERS  *URINARY PROBLEMS  *BOWEL PROBLEMS  UNUSUAL RASH Items with * indicate a potential emergency and should be followed up as soon as possible.  Feel free to call the clinic you have any questions or concerns. The clinic phone number is [phone].    

## 2013-10-14 NOTE — Telephone Encounter (Signed)
gv and printed appt sched na [facility] for pt for Jan adn Feb   sed added tx.

## 2013-10-14 NOTE — Progress Notes (Signed)
Provided patient with booklet : Preparing Advance Directives at his request. Informed him to consult with social worker for any questions.

## 2013-10-14 NOTE — Progress Notes (Signed)
Followup completed with patient who continues chemotherapy for metastatic colon cancer.  Patient reports his appetite has increased and he is eating very well.  Weight has increased to 147.7 pounds January 13 from 144.4 pounds December 16.  Patient denies nutritional side effects.  He is excited that he can consume cold beverages and foods now.  He is appreciative of the change in his chemotherapy regimen as it has resulted in an improvement in his quality-of-life.  Nutrition diagnosis: Unintentional weight loss improved.  Intervention: Provided support and encouragement for patient to continue increased oral intake for weight gain.  Patient to continue to use oral nutrition supplements such as Ensure Plus as needed for additional calories and protein.  Patient declines additional coupons today as he has enough at home.  Teach back method used.  Monitoring, evaluation, goals: Patient has been able to increase oral intake secondary to increased appetite and has had a slight weight gain.  Next visit: Tuesday, January 27, during chemotherapy.

## 2013-10-14 NOTE — Progress Notes (Signed)
   [place]    OFFICE PROGRESS NOTE   INTERVAL HISTORY:   He completed a first cycle of FOLFIRI 09/29/2013. He reports developing abdominal cramping and diarrhea approximately 10 minutes into the irinotecan infusion. The symptoms improved with atropine. He developed constipation for 3-4 days after chemotherapy. He is now having bowel movements. Good appetite. He takes approximately 2 Percocet tablets per day for relief of abdominal pain.  Objective:  Vital signs in last 24 hours:  Blood pressure 93/78, pulse 128, temperature 97.9 F (36.6 C), temperature source Oral, resp. rate 20, height 5\' 10"  (1.778 m), weight 147 lb 11.2 oz (66.996 kg).    HEENT: No thrush or ulcers Resp: Lungs clear bilaterally Cardio: Regular rate and rhythm GI: No hepatomegaly, mild tenderness in the upper abdomen, no mass Vascular: No leg edema    Portacath/PICC-without erythema  Lab Results:  Lab Results  Component Value Date   WBC 5.7 10/14/2013   HGB 10.8* 10/14/2013   HCT 33.6* 10/14/2013   MCV 98.2* 10/14/2013   PLT 275 10/14/2013   NEUTROABS 3.1 10/14/2013      Medications: I have reviewed the patient's current medications.  Assessment/Plan: 1. Hepatic flexure mass with extensive liver metastases. Status post a colonoscopic biopsy on 04/14/2013 confirming at least high-grade glandular dysplasia.  Status post liver biopsy 05/05/2013 confirming metastatic colorectal adenocarcinoma.  Cycle 1 FOLFOX 05/14/2013.  Cycle 2 FOLFOX 05/27/2013.  Cycle 3 FOLFOX 06/10/2013.  Cycle 4 FOLFOX 06/24/2013.  Cycle 5 FOLFOX 07/15/2013.  Restaging CT evaluation 07/25/2013 showed some liver lesions were stable while others had increased in size. There was a generalized decrease in abdominal lymphadenopathy.  Cycle 6 of FOLFOX 07/29/2013.  Cycle 7 of FOLFOX 08/12/2013.  Cycle 8 FOLFOX 09/02/2013.  Cycle 9 FOLFOX (oxaliplatin held) 09/16/2013  CT 09/29/2013 with stable liver metastases,  hepatic flexure mass, and adenopathy. Increased thickening at the ileum with surrounding ascites Cycle 1 of FOLFIRI 09/29/2013 2. Microcytic anemia secondary to #1. He was transfused with PRBCs on 04/30/2013 when the hemoglobin returned at 6.5. He was also given a dose of IV iron. He continues oral iron.  3. Hepatitis C positive. 4. Weight loss.  5. COPD. 6. Tobacco use. 7. History of alcohol use. 8. Nausea following cycle 7 FOLFOX-prolonged period, he reports relief of the nausea with Compazine.  9. Abdominal pain-progressive, potentially related to the liver metastases, colon mass, or inflammation at the terminal ileum. The terminal ileum inflammation could be related to vascular compromise  Disposition:  He appears stable. He tolerated the FOLFIRI well except for abdominal cramping during the irinotecan infusion. He will complete cycle 2 today. Tony Barajas will return for an office visit and chemotherapy in 2 weeks.   Betsy Coder, MD  10/14/2013  9:08 AM

## 2013-10-16 ENCOUNTER — Ambulatory Visit (HOSPITAL_BASED_OUTPATIENT_CLINIC_OR_DEPARTMENT_OTHER): Payer: 59

## 2013-10-16 VITALS — BP 90/62 | HR 110 | Temp 97.5°F

## 2013-10-16 DIAGNOSIS — C183 Malignant neoplasm of hepatic flexure: Secondary | ICD-10-CM

## 2013-10-16 DIAGNOSIS — C189 Malignant neoplasm of colon, unspecified: Secondary | ICD-10-CM

## 2013-10-16 DIAGNOSIS — C787 Secondary malignant neoplasm of liver and intrahepatic bile duct: Secondary | ICD-10-CM

## 2013-10-16 MED ORDER — SODIUM CHLORIDE 0.9 % IJ SOLN
10.0000 mL | INTRAMUSCULAR | Status: DC | PRN
  Administered 2013-10-16: 10 mL
  Filled 2013-10-16: qty 10

## 2013-10-16 MED ORDER — HEPARIN SOD (PORK) LOCK FLUSH 100 UNIT/ML IV SOLN
500.0000 [IU] | Freq: Once | INTRAVENOUS | Status: AC | PRN
  Administered 2013-10-16: 500 [IU]
  Filled 2013-10-16: qty 5

## 2013-10-26 ENCOUNTER — Other Ambulatory Visit: Payer: Self-pay | Admitting: Oncology

## 2013-10-28 ENCOUNTER — Ambulatory Visit: Payer: 59 | Admitting: Nutrition

## 2013-10-28 ENCOUNTER — Telehealth: Payer: Self-pay | Admitting: Oncology

## 2013-10-28 ENCOUNTER — Telehealth: Payer: Self-pay | Admitting: *Deleted

## 2013-10-28 ENCOUNTER — Ambulatory Visit (HOSPITAL_BASED_OUTPATIENT_CLINIC_OR_DEPARTMENT_OTHER): Payer: 59 | Admitting: Oncology

## 2013-10-28 ENCOUNTER — Other Ambulatory Visit (HOSPITAL_BASED_OUTPATIENT_CLINIC_OR_DEPARTMENT_OTHER): Payer: 59

## 2013-10-28 ENCOUNTER — Other Ambulatory Visit: Payer: Self-pay | Admitting: *Deleted

## 2013-10-28 ENCOUNTER — Ambulatory Visit (HOSPITAL_BASED_OUTPATIENT_CLINIC_OR_DEPARTMENT_OTHER): Payer: 59

## 2013-10-28 VITALS — BP 85/62 | HR 127 | Temp 97.6°F | Resp 18 | Ht 70.0 in | Wt 147.5 lb

## 2013-10-28 DIAGNOSIS — C787 Secondary malignant neoplasm of liver and intrahepatic bile duct: Secondary | ICD-10-CM

## 2013-10-28 DIAGNOSIS — J449 Chronic obstructive pulmonary disease, unspecified: Secondary | ICD-10-CM

## 2013-10-28 DIAGNOSIS — F17200 Nicotine dependence, unspecified, uncomplicated: Secondary | ICD-10-CM

## 2013-10-28 DIAGNOSIS — C189 Malignant neoplasm of colon, unspecified: Secondary | ICD-10-CM

## 2013-10-28 DIAGNOSIS — D509 Iron deficiency anemia, unspecified: Secondary | ICD-10-CM

## 2013-10-28 DIAGNOSIS — C183 Malignant neoplasm of hepatic flexure: Secondary | ICD-10-CM

## 2013-10-28 DIAGNOSIS — E860 Dehydration: Secondary | ICD-10-CM

## 2013-10-28 DIAGNOSIS — R634 Abnormal weight loss: Secondary | ICD-10-CM

## 2013-10-28 DIAGNOSIS — Z5111 Encounter for antineoplastic chemotherapy: Secondary | ICD-10-CM

## 2013-10-28 DIAGNOSIS — R109 Unspecified abdominal pain: Secondary | ICD-10-CM

## 2013-10-28 DIAGNOSIS — B1920 Unspecified viral hepatitis C without hepatic coma: Secondary | ICD-10-CM

## 2013-10-28 LAB — COMPREHENSIVE METABOLIC PANEL (CC13)
ALBUMIN: 2.5 g/dL — AB (ref 3.5–5.0)
ALK PHOS: 145 U/L (ref 40–150)
ALT: 13 U/L (ref 0–55)
AST: 20 U/L (ref 5–34)
Anion Gap: 11 mEq/L (ref 3–11)
BUN: 10.3 mg/dL (ref 7.0–26.0)
CALCIUM: 8.5 mg/dL (ref 8.4–10.4)
CO2: 21 mEq/L — ABNORMAL LOW (ref 22–29)
Chloride: 106 mEq/L (ref 98–109)
Creatinine: 0.7 mg/dL (ref 0.7–1.3)
Glucose: 89 mg/dl (ref 70–140)
POTASSIUM: 3.9 meq/L (ref 3.5–5.1)
SODIUM: 137 meq/L (ref 136–145)
TOTAL PROTEIN: 7.1 g/dL (ref 6.4–8.3)
Total Bilirubin: 0.45 mg/dL (ref 0.20–1.20)

## 2013-10-28 LAB — CBC WITH DIFFERENTIAL/PLATELET
BASO%: 0.2 % (ref 0.0–2.0)
Basophils Absolute: 0 10*3/uL (ref 0.0–0.1)
EOS%: 2.3 % (ref 0.0–7.0)
Eosinophils Absolute: 0.1 10*3/uL (ref 0.0–0.5)
HEMATOCRIT: 35 % — AB (ref 38.4–49.9)
HEMOGLOBIN: 11.5 g/dL — AB (ref 13.0–17.1)
LYMPH#: 1.6 10*3/uL (ref 0.9–3.3)
LYMPH%: 30.5 % (ref 14.0–49.0)
MCH: 31.3 pg (ref 27.2–33.4)
MCHC: 32.9 g/dL (ref 32.0–36.0)
MCV: 95.4 fL (ref 79.3–98.0)
MONO#: 0.7 10*3/uL (ref 0.1–0.9)
MONO%: 13.2 % (ref 0.0–14.0)
NEUT#: 2.9 10*3/uL (ref 1.5–6.5)
NEUT%: 53.8 % (ref 39.0–75.0)
NRBC: 0 % (ref 0–0)
Platelets: 304 10*3/uL (ref 140–400)
RBC: 3.67 10*6/uL — ABNORMAL LOW (ref 4.20–5.82)
RDW: 16.1 % — AB (ref 11.0–14.6)
WBC: 5.3 10*3/uL (ref 4.0–10.3)

## 2013-10-28 MED ORDER — DEXAMETHASONE SODIUM PHOSPHATE 20 MG/5ML IJ SOLN
INTRAMUSCULAR | Status: AC
  Filled 2013-10-28: qty 5

## 2013-10-28 MED ORDER — DEXAMETHASONE SODIUM PHOSPHATE 20 MG/5ML IJ SOLN
20.0000 mg | Freq: Once | INTRAMUSCULAR | Status: AC
  Administered 2013-10-28: 20 mg via INTRAVENOUS

## 2013-10-28 MED ORDER — SODIUM CHLORIDE 0.9 % IV SOLN
Freq: Once | INTRAVENOUS | Status: AC
  Administered 2013-10-28: 10:00:00 via INTRAVENOUS

## 2013-10-28 MED ORDER — PALONOSETRON HCL INJECTION 0.25 MG/5ML
0.2500 mg | Freq: Once | INTRAVENOUS | Status: AC
  Administered 2013-10-28: 0.25 mg via INTRAVENOUS

## 2013-10-28 MED ORDER — DEXTROSE 5 % IV SOLN
750.0000 mg | Freq: Once | INTRAVENOUS | Status: AC
  Administered 2013-10-28: 750 mg via INTRAVENOUS
  Filled 2013-10-28: qty 37.5

## 2013-10-28 MED ORDER — PALONOSETRON HCL INJECTION 0.25 MG/5ML
INTRAVENOUS | Status: AC
  Filled 2013-10-28: qty 5

## 2013-10-28 MED ORDER — SODIUM CHLORIDE 0.9 % IV SOLN
1000.0000 mL | Freq: Once | INTRAVENOUS | Status: AC
  Administered 2013-10-28: 1000 mL via INTRAVENOUS

## 2013-10-28 MED ORDER — SODIUM CHLORIDE 0.9 % IJ SOLN
10.0000 mL | INTRAMUSCULAR | Status: DC | PRN
  Filled 2013-10-28: qty 10

## 2013-10-28 MED ORDER — ATROPINE SULFATE 1 MG/ML IJ SOLN
0.5000 mg | Freq: Once | INTRAMUSCULAR | Status: AC | PRN
  Administered 2013-10-28: 0.5 mg via INTRAVENOUS

## 2013-10-28 MED ORDER — ATROPINE SULFATE 1 MG/ML IJ SOLN
INTRAMUSCULAR | Status: AC
  Filled 2013-10-28: qty 1

## 2013-10-28 MED ORDER — FLUOROURACIL CHEMO INJECTION 2.5 GM/50ML
400.0000 mg/m2 | Freq: Once | INTRAVENOUS | Status: AC
  Administered 2013-10-28: 750 mg via INTRAVENOUS
  Filled 2013-10-28: qty 15

## 2013-10-28 MED ORDER — HEPARIN SOD (PORK) LOCK FLUSH 100 UNIT/ML IV SOLN
500.0000 [IU] | Freq: Once | INTRAVENOUS | Status: DC | PRN
  Filled 2013-10-28: qty 5

## 2013-10-28 MED ORDER — SODIUM CHLORIDE 0.9 % IV SOLN
2400.0000 mg/m2 | INTRAVENOUS | Status: DC
  Administered 2013-10-28: 4350 mg via INTRAVENOUS
  Filled 2013-10-28: qty 87

## 2013-10-28 MED ORDER — OXYCODONE-ACETAMINOPHEN 5-325 MG PO TABS: 1.0000 | ORAL_TABLET | ORAL | Status: DC | PRN

## 2013-10-28 MED ORDER — IRINOTECAN HCL CHEMO INJECTION 100 MG/5ML
320.0000 mg | Freq: Once | INTRAVENOUS | Status: AC
  Administered 2013-10-28: 320 mg via INTRAVENOUS
  Filled 2013-10-28: qty 16

## 2013-10-28 NOTE — Progress Notes (Signed)
Nutrition follow up completed with patient who continues chemotherapy for metastatic colon cancer.  Patient reports he feels well.  He has a good appetite.  His weight is stable and was documented as 147.5 pounds January 27 from 147.7 pounds January 13.  Patient denies nutrition side effects.  Nutrition diagnosis: Unintentional weight loss improved.  Intervention: Provided support and encouragement for patient to continue increased oral intake for weight gain.  Patient to continue to use oral nutrition supplements such as Ensure Plus as needed for additional calories and protein.  I provided patient with his third and final case of Ensure Plus to take today.  Teach back method used.  Monitoring, evaluation, goals: Patient has been able to consume adequate oral intake for weight maintenance.  He will continue to supplement with Ensure Plus as needed.  Next visit: Tuesday, February 10, during chemotherapy.

## 2013-10-28 NOTE — Telephone Encounter (Signed)
Gave pt appt for lab, md and chemo for january and February emailed [facility] regarding chemo on 11/25/13

## 2013-10-28 NOTE — Progress Notes (Signed)
   Tony Barajas    OFFICE PROGRESS NOTE   INTERVAL HISTORY:   He returns for scheduled followup of metastatic colon cancer. Tony Barajas completed another cycle of FOLFIRI on 10/14/2013. He reports 2-3 loose stools on one day following chemotherapy. No further diarrhea. No nausea or vomiting. The abdominal pain improved with "Beano ". He is having bowel movements.  Objective:  Vital signs in last 24 hours:  Blood pressure 85/62, pulse 127, temperature 97.6 F (36.4 C), temperature source Oral, resp. rate 18, height 5\' 10"  (1.778 m), weight 147 lb 8 oz (66.906 kg). repeat manual pulse 108    HEENT: No thrush or ulcers Resp: Lungs clear bilaterally Cardio: Regular rate and rhythm GI: No hepatomegaly, nontender, mildly distended, no mass Vascular: No leg edema   Portacath/PICC-without erythema  Lab Results:  Lab Results  Component Value Date   WBC 5.3 10/28/2013   HGB 11.5* 10/28/2013   HCT 35.0* 10/28/2013   MCV 95.4 10/28/2013   PLT 304 10/28/2013   NEUTROABS 2.9 10/28/2013      Medications: I have reviewed the patient's current medications.  Assessment/Plan: 1. Hepatic flexure mass with extensive liver metastases. Status post a colonoscopic biopsy on 04/14/2013 confirming at least high-grade glandular dysplasia.  Status post liver biopsy 05/05/2013 confirming metastatic colorectal adenocarcinoma.  Cycle 1 FOLFOX 05/14/2013.  Cycle 2 FOLFOX 05/27/2013.  Cycle 3 FOLFOX 06/10/2013.  Cycle 4 FOLFOX 06/24/2013.  Cycle 5 FOLFOX 07/15/2013.  Restaging CT evaluation 07/25/2013 showed some liver lesions were stable while others had increased in size. There was a generalized decrease in abdominal lymphadenopathy.  Cycle 6 of FOLFOX 07/29/2013.  Cycle 7 of FOLFOX 08/12/2013.  Cycle 8 FOLFOX 09/02/2013.  Cycle 9 FOLFOX (oxaliplatin held) 09/16/2013  CT 09/29/2013 with stable liver metastases, hepatic flexure mass, and adenopathy. Increased thickening at the ileum  with surrounding ascites  Cycle 1 of FOLFIRI 09/29/2013 Cycle 2 FOLFIRI 10/14/2013  2. Microcytic anemia secondary to #1. He was transfused with PRBCs on 04/30/2013 when the hemoglobin returned at 6.5. He was also given a dose of IV iron. He continues oral iron.  the hemoglobin is stable. 3. Hepatitis C positive. 4. Weight loss.  5. COPD. 6. Tobacco use. 7. History of alcohol use. 8. Nausea following cycle 7 FOLFOX-prolonged period, he reports relief of the nausea with Compazine.  9. Abdominal pain-progressive, potentially related to the liver metastases, colon mass, or inflammation at the terminal ileum. The terminal ileum inflammation could be related to vascular compromise   Disposition:   he has completed 2 cycles of FOLFIRI. Tony Barajas appears to be tolerating the FOLFIRI well. The plan is to proceed with cycle 3 today. He reports limited fluid intake yesterday.His vital signs will be repeated and he will receive additional intravenous fluids in the chemotherapy room today.  Tony Barajas will return for an office visit and chemotherapy in 2 weeks.   Betsy Coder, MD  10/28/2013  6:46 PM

## 2013-10-28 NOTE — Telephone Encounter (Signed)
Per staff message and POF I have scheduled appts.  JMW  

## 2013-10-29 ENCOUNTER — Other Ambulatory Visit: Payer: Self-pay | Admitting: *Deleted

## 2013-10-30 ENCOUNTER — Encounter: Payer: Self-pay | Admitting: *Deleted

## 2013-10-30 ENCOUNTER — Ambulatory Visit (HOSPITAL_BASED_OUTPATIENT_CLINIC_OR_DEPARTMENT_OTHER): Payer: 59

## 2013-10-30 VITALS — BP 107/64 | HR 96 | Temp 98.3°F

## 2013-10-30 DIAGNOSIS — C189 Malignant neoplasm of colon, unspecified: Secondary | ICD-10-CM

## 2013-10-30 DIAGNOSIS — C183 Malignant neoplasm of hepatic flexure: Secondary | ICD-10-CM

## 2013-10-30 DIAGNOSIS — C787 Secondary malignant neoplasm of liver and intrahepatic bile duct: Secondary | ICD-10-CM

## 2013-10-30 DIAGNOSIS — Z452 Encounter for adjustment and management of vascular access device: Secondary | ICD-10-CM

## 2013-10-30 MED ORDER — HEPARIN SOD (PORK) LOCK FLUSH 100 UNIT/ML IV SOLN
500.0000 [IU] | Freq: Once | INTRAVENOUS | Status: AC | PRN
  Administered 2013-10-30: 500 [IU]
  Filled 2013-10-30: qty 5

## 2013-10-30 MED ORDER — SODIUM CHLORIDE 0.9 % IJ SOLN
10.0000 mL | INTRAMUSCULAR | Status: DC | PRN
  Administered 2013-10-30: 10 mL
  Filled 2013-10-30: qty 10

## 2013-10-30 NOTE — Patient Instructions (Signed)

## 2013-10-30 NOTE — Progress Notes (Signed)
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 Forwarded to [facility] in managed care.

## 2013-10-31 ENCOUNTER — Telehealth: Payer: Self-pay | Admitting: *Deleted

## 2013-10-31 ENCOUNTER — Encounter: Payer: Self-pay | Admitting: Oncology

## 2013-10-31 NOTE — Progress Notes (Signed)
Put disability form on nurse's desk. °

## 2013-10-31 NOTE — Telephone Encounter (Signed)
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 His short term disability has "run out".  Forms were received and forwarded to managed care yesterday.

## 2013-11-04 ENCOUNTER — Encounter: Payer: Self-pay | Admitting: Oncology

## 2013-11-04 NOTE — Progress Notes (Signed)
Faxed disability form to [employer] [phone]

## 2013-11-09 ENCOUNTER — Other Ambulatory Visit: Payer: Self-pay | Admitting: Oncology

## 2013-11-11 ENCOUNTER — Telehealth: Payer: Self-pay | Admitting: Oncology

## 2013-11-11 ENCOUNTER — Ambulatory Visit (HOSPITAL_BASED_OUTPATIENT_CLINIC_OR_DEPARTMENT_OTHER): Payer: 59

## 2013-11-11 ENCOUNTER — Ambulatory Visit (HOSPITAL_BASED_OUTPATIENT_CLINIC_OR_DEPARTMENT_OTHER): Payer: 59 | Admitting: Nurse Practitioner

## 2013-11-11 ENCOUNTER — Ambulatory Visit: Payer: 59 | Admitting: Nutrition

## 2013-11-11 ENCOUNTER — Other Ambulatory Visit (HOSPITAL_BASED_OUTPATIENT_CLINIC_OR_DEPARTMENT_OTHER): Payer: 59

## 2013-11-11 VITALS — BP 91/62 | HR 94

## 2013-11-11 VITALS — BP 89/57 | HR 129 | Temp 97.1°F | Resp 18 | Ht 70.0 in | Wt 145.9 lb

## 2013-11-11 DIAGNOSIS — C189 Malignant neoplasm of colon, unspecified: Secondary | ICD-10-CM

## 2013-11-11 DIAGNOSIS — C183 Malignant neoplasm of hepatic flexure: Secondary | ICD-10-CM

## 2013-11-11 DIAGNOSIS — R110 Nausea: Secondary | ICD-10-CM

## 2013-11-11 DIAGNOSIS — C787 Secondary malignant neoplasm of liver and intrahepatic bile duct: Secondary | ICD-10-CM

## 2013-11-11 DIAGNOSIS — D630 Anemia in neoplastic disease: Secondary | ICD-10-CM

## 2013-11-11 DIAGNOSIS — R109 Unspecified abdominal pain: Secondary | ICD-10-CM

## 2013-11-11 DIAGNOSIS — B1920 Unspecified viral hepatitis C without hepatic coma: Secondary | ICD-10-CM

## 2013-11-11 DIAGNOSIS — Z5111 Encounter for antineoplastic chemotherapy: Secondary | ICD-10-CM

## 2013-11-11 LAB — CBC WITH DIFFERENTIAL/PLATELET
BASO%: 0.1 % (ref 0.0–2.0)
Basophils Absolute: 0 10*3/uL (ref 0.0–0.1)
EOS%: 1.3 % (ref 0.0–7.0)
Eosinophils Absolute: 0.1 10*3/uL (ref 0.0–0.5)
HCT: 36 % — ABNORMAL LOW (ref 38.4–49.9)
HGB: 11.7 g/dL — ABNORMAL LOW (ref 13.0–17.1)
LYMPH%: 29.4 % (ref 14.0–49.0)
MCH: 30.7 pg (ref 27.2–33.4)
MCHC: 32.5 g/dL (ref 32.0–36.0)
MCV: 94.5 fL (ref 79.3–98.0)
MONO#: 0.9 10*3/uL (ref 0.1–0.9)
MONO%: 12.9 % (ref 0.0–14.0)
NEUT%: 56.3 % (ref 39.0–75.0)
NEUTROS ABS: 3.9 10*3/uL (ref 1.5–6.5)
Platelets: 328 10*3/uL (ref 140–400)
RBC: 3.81 10*6/uL — AB (ref 4.20–5.82)
RDW: 16.3 % — AB (ref 11.0–14.6)
WBC: 7 10*3/uL (ref 4.0–10.3)
lymph#: 2.1 10*3/uL (ref 0.9–3.3)

## 2013-11-11 LAB — COMPREHENSIVE METABOLIC PANEL (CC13)
ALBUMIN: 2.8 g/dL — AB (ref 3.5–5.0)
ALT: 12 U/L (ref 0–55)
AST: 20 U/L (ref 5–34)
Alkaline Phosphatase: 132 U/L (ref 40–150)
Anion Gap: 10 mEq/L (ref 3–11)
BUN: 14.7 mg/dL (ref 7.0–26.0)
CO2: 23 mEq/L (ref 22–29)
Calcium: 8.9 mg/dL (ref 8.4–10.4)
Chloride: 106 mEq/L (ref 98–109)
Creatinine: 0.7 mg/dL (ref 0.7–1.3)
GLUCOSE: 104 mg/dL (ref 70–140)
POTASSIUM: 4.1 meq/L (ref 3.5–5.1)
SODIUM: 139 meq/L (ref 136–145)
TOTAL PROTEIN: 7.2 g/dL (ref 6.4–8.3)
Total Bilirubin: 0.58 mg/dL (ref 0.20–1.20)

## 2013-11-11 MED ORDER — PALONOSETRON HCL INJECTION 0.25 MG/5ML
INTRAVENOUS | Status: AC
  Filled 2013-11-11: qty 5

## 2013-11-11 MED ORDER — ATROPINE SULFATE 1 MG/ML IJ SOLN
INTRAMUSCULAR | Status: AC
  Filled 2013-11-11: qty 1

## 2013-11-11 MED ORDER — FLUOROURACIL CHEMO INJECTION 2.5 GM/50ML
400.0000 mg/m2 | Freq: Once | INTRAVENOUS | Status: AC
  Administered 2013-11-11: 750 mg via INTRAVENOUS
  Filled 2013-11-11: qty 15

## 2013-11-11 MED ORDER — DEXTROSE 5 % IV SOLN
320.0000 mg | Freq: Once | INTRAVENOUS | Status: AC
  Administered 2013-11-11: 320 mg via INTRAVENOUS
  Filled 2013-11-11: qty 16

## 2013-11-11 MED ORDER — PALONOSETRON HCL INJECTION 0.25 MG/5ML
0.2500 mg | Freq: Once | INTRAVENOUS | Status: AC
  Administered 2013-11-11: 0.25 mg via INTRAVENOUS

## 2013-11-11 MED ORDER — SODIUM CHLORIDE 0.9 % IV SOLN
Freq: Once | INTRAVENOUS | Status: AC
  Administered 2013-11-11: 11:00:00 via INTRAVENOUS

## 2013-11-11 MED ORDER — SODIUM CHLORIDE 0.9 % IV SOLN
2400.0000 mg/m2 | INTRAVENOUS | Status: DC
  Administered 2013-11-11: 4350 mg via INTRAVENOUS
  Filled 2013-11-11: qty 87

## 2013-11-11 MED ORDER — LEUCOVORIN CALCIUM INJECTION 350 MG
750.0000 mg | Freq: Once | INTRAVENOUS | Status: AC
  Administered 2013-11-11: 750 mg via INTRAVENOUS
  Filled 2013-11-11: qty 37.5

## 2013-11-11 MED ORDER — ATROPINE SULFATE 1 MG/ML IJ SOLN
0.5000 mg | Freq: Once | INTRAMUSCULAR | Status: AC | PRN
  Administered 2013-11-11: 0.5 mg via INTRAVENOUS

## 2013-11-11 MED ORDER — DEXAMETHASONE SODIUM PHOSPHATE 20 MG/5ML IJ SOLN
20.0000 mg | Freq: Once | INTRAMUSCULAR | Status: AC
  Administered 2013-11-11: 20 mg via INTRAVENOUS

## 2013-11-11 MED ORDER — DEXAMETHASONE SODIUM PHOSPHATE 20 MG/5ML IJ SOLN
INTRAMUSCULAR | Status: AC
  Filled 2013-11-11: qty 5

## 2013-11-11 NOTE — Telephone Encounter (Signed)
gv and printed appt sch3ed and avs for pt for Feb and March....sed added tx.

## 2013-11-11 NOTE — Progress Notes (Signed)
OFFICE PROGRESS NOTE  Interval history:  Tony Barajas returns for followup of metastatic colon cancer. He completed cycle 3 of FOLFIRI on 10/28/2013. He denies nausea/vomiting. No mouth sores. For 5 days after the chemotherapy he had some loose stools. He had "gas" pain yesterday. Gas-X did not help. He reports recent blurry vision. No diplopia. He reports adequate fluid intake.   Objective: Filed Vitals:   11/11/13 0911  BP: 89/57  Pulse: 129  Temp: 97.1 F (36.2 C)  Resp: 18   Repeat heart rate 106.  No thrush or ulcerations. Lungs clear. Regular cardiac rhythm. Port-A-Cath site without erythema. Abdomen soft and nontender. Mildly distended. No hepatomegaly. No leg edema. Pupils equal round and reactive to light. Extraocular movements intact.   Lab Results: Lab Results  Component Value Date   WBC 7.0 11/11/2013   HGB 11.7* 11/11/2013   HCT 36.0* 11/11/2013   MCV 94.5 11/11/2013   PLT 328 11/11/2013   NEUTROABS 3.9 11/11/2013    Chemistry:    Chemistry      Component Value Date/Time   NA 137 10/28/2013 0759   NA 139 03/14/2013 1023   K 3.9 10/28/2013 0759   K 4.0 03/14/2013 1023   CL 104 03/14/2013 1023   CO2 21* 10/28/2013 0759   CO2 22 03/14/2013 1023   BUN 10.3 10/28/2013 0759   BUN 10 03/14/2013 1023   CREATININE 0.7 10/28/2013 0759   CREATININE 0.7 03/14/2013 1023      Component Value Date/Time   CALCIUM 8.5 10/28/2013 0759   CALCIUM 9.1 03/14/2013 1023   ALKPHOS 145 10/28/2013 0759   ALKPHOS 82 03/14/2013 1023   AST 20 10/28/2013 0759   AST 33 03/14/2013 1023   ALT 13 10/28/2013 0759   ALT 26 03/14/2013 1023   BILITOT 0.45 10/28/2013 0759   BILITOT 0.5 03/14/2013 1023       Studies/Results: No results found.  Medications: I have reviewed the patient's current medications.  Assessment/Plan: 1. Hepatic flexure mass with extensive liver metastases. Status post a colonoscopic biopsy on 04/14/2013 confirming at least high-grade glandular dysplasia.  Status post liver  biopsy 05/05/2013 confirming metastatic colorectal adenocarcinoma.  Cycle 1 FOLFOX 05/14/2013.  Cycle 2 FOLFOX 05/27/2013.  Cycle 3 FOLFOX 06/10/2013.  Cycle 4 FOLFOX 06/24/2013.  Cycle 5 FOLFOX 07/15/2013.  Restaging CT evaluation 07/25/2013 showed some liver lesions were stable while others had increased in size. There was a generalized decrease in abdominal lymphadenopathy.  Cycle 6 of FOLFOX 07/29/2013.  Cycle 7 of FOLFOX 08/12/2013.  Cycle 8 FOLFOX 09/02/2013.  Cycle 9 FOLFOX (oxaliplatin held) 09/16/2013  CT 09/29/2013 with stable liver metastases, hepatic flexure mass, and adenopathy. Increased thickening at the ileum with surrounding ascites  Cycle 1 of FOLFIRI 09/29/2013  Cycle 2 FOLFIRI 10/14/2013 Cycle 3 FOLFIRI 10/28/2013  2. Microcytic anemia secondary to #1. He was transfused with PRBCs on 04/30/2013 when the hemoglobin returned at 6.5. He was also given a dose of IV iron. He continues oral iron. The hemoglobin is stable. 3. Hepatitis C positive. 4. Weight loss.  5. COPD. 6. Tobacco use. 7. History of alcohol use. 8. Nausea following cycle 7 FOLFOX-prolonged period, he reports relief of the nausea with Compazine.  9. Abdominal pain-progressive, potentially related to the liver metastases, colon mass, or inflammation at the terminal ileum. The terminal ileum inflammation could be related to vascular compromise.  Dispositon-he has completed 3 cycles of FOLFIRI. Plan to proceed with cycle 4 today as scheduled. Restaging CT evaluation is planned after  cycle 5.  He will return for a followup visit in 2 weeks. He will contact the office in the interim with any problems. We specifically discussed worsening abdominal pain.  Plan reviewed with Dr. Benay Spice.   Ned Card ANP/GNP-BC

## 2013-11-11 NOTE — Patient Instructions (Signed)
[facility] Discharge Instructions for Patients Receiving Chemotherapy  Today you received the following chemotherapy agents: Camptosar, Leucovorin, 5FU  To help prevent nausea and vomiting after your treatment, we encourage you to take your nausea medication as prescribed. You received Aloxi and Decadron for nausea and Atropine for diarrhea prior to your chemotherapy.    If you develop nausea and vomiting that is not controlled by your nausea medication, call the clinic.   BELOW ARE SYMPTOMS THAT SHOULD BE REPORTED IMMEDIATELY:  *FEVER GREATER THAN 100.5 F  *CHILLS WITH OR WITHOUT FEVER  NAUSEA AND VOMITING THAT IS NOT CONTROLLED WITH YOUR NAUSEA MEDICATION  *UNUSUAL SHORTNESS OF BREATH  *UNUSUAL BRUISING OR BLEEDING  TENDERNESS IN MOUTH AND THROAT WITH OR WITHOUT PRESENCE OF ULCERS  *URINARY PROBLEMS  *BOWEL PROBLEMS  UNUSUAL RASH Items with * indicate a potential emergency and should be followed up as soon as possible.  Feel free to call the clinic you have any questions or concerns. The clinic phone number is [phone].

## 2013-11-11 NOTE — Progress Notes (Signed)
Patient expresses no concerns regarding nutrition intake.  He states he is eating as well as he has been.  He is unclear why he has lost weight.  Weight was documented as 145.9 pounds February 10, down from 147.5 pounds January 27.  He does complain about increased gas today.  Nutrition diagnosis: Unintended weight loss continues.  Intervention: I recommended patient continue to use Ensure Plus once to twice daily on a continual basis.  Offered coupons/samples however, patient declines.  No other needs identified.  Monitoring, evaluation, goals: Patient is tolerating oral intake but has lost about one pound and a half.  He will continue to supplement with Ensure Plus.  Next visit: Tuesday, March 17, during chemotherapy.

## 2013-11-13 ENCOUNTER — Ambulatory Visit (HOSPITAL_BASED_OUTPATIENT_CLINIC_OR_DEPARTMENT_OTHER): Payer: 59

## 2013-11-13 VITALS — BP 109/63 | HR 124 | Temp 97.3°F

## 2013-11-13 DIAGNOSIS — C787 Secondary malignant neoplasm of liver and intrahepatic bile duct: Secondary | ICD-10-CM

## 2013-11-13 DIAGNOSIS — C189 Malignant neoplasm of colon, unspecified: Secondary | ICD-10-CM

## 2013-11-13 DIAGNOSIS — C183 Malignant neoplasm of hepatic flexure: Secondary | ICD-10-CM

## 2013-11-13 MED ORDER — HEPARIN SOD (PORK) LOCK FLUSH 100 UNIT/ML IV SOLN
500.0000 [IU] | Freq: Once | INTRAVENOUS | Status: AC | PRN
  Administered 2013-11-13: 500 [IU]
  Filled 2013-11-13: qty 5

## 2013-11-13 MED ORDER — SODIUM CHLORIDE 0.9 % IJ SOLN
10.0000 mL | INTRAMUSCULAR | Status: DC | PRN
  Administered 2013-11-13: 10 mL
  Filled 2013-11-13: qty 10

## 2013-11-23 ENCOUNTER — Other Ambulatory Visit: Payer: Self-pay | Admitting: Oncology

## 2013-11-25 ENCOUNTER — Other Ambulatory Visit: Payer: 59

## 2013-11-25 ENCOUNTER — Ambulatory Visit: Payer: 59 | Admitting: Oncology

## 2013-11-25 ENCOUNTER — Inpatient Hospital Stay: Payer: 59

## 2013-12-10 ENCOUNTER — Encounter: Payer: Self-pay | Admitting: *Deleted

## 2013-12-16 ENCOUNTER — Ambulatory Visit (HOSPITAL_BASED_OUTPATIENT_CLINIC_OR_DEPARTMENT_OTHER): Payer: 59

## 2013-12-16 ENCOUNTER — Other Ambulatory Visit: Payer: Self-pay | Admitting: *Deleted

## 2013-12-16 ENCOUNTER — Telehealth: Payer: Self-pay | Admitting: Oncology

## 2013-12-16 ENCOUNTER — Ambulatory Visit: Payer: 59 | Admitting: Nutrition

## 2013-12-16 ENCOUNTER — Ambulatory Visit (HOSPITAL_BASED_OUTPATIENT_CLINIC_OR_DEPARTMENT_OTHER): Payer: 59 | Admitting: Oncology

## 2013-12-16 ENCOUNTER — Other Ambulatory Visit (HOSPITAL_BASED_OUTPATIENT_CLINIC_OR_DEPARTMENT_OTHER): Payer: 59

## 2013-12-16 VITALS — BP 94/68 | HR 142 | Temp 97.0°F | Resp 18 | Ht 70.0 in | Wt 139.9 lb

## 2013-12-16 DIAGNOSIS — C787 Secondary malignant neoplasm of liver and intrahepatic bile duct: Secondary | ICD-10-CM

## 2013-12-16 DIAGNOSIS — D509 Iron deficiency anemia, unspecified: Secondary | ICD-10-CM

## 2013-12-16 DIAGNOSIS — C183 Malignant neoplasm of hepatic flexure: Secondary | ICD-10-CM

## 2013-12-16 DIAGNOSIS — F17200 Nicotine dependence, unspecified, uncomplicated: Secondary | ICD-10-CM

## 2013-12-16 DIAGNOSIS — R188 Other ascites: Secondary | ICD-10-CM

## 2013-12-16 DIAGNOSIS — R109 Unspecified abdominal pain: Secondary | ICD-10-CM

## 2013-12-16 DIAGNOSIS — C189 Malignant neoplasm of colon, unspecified: Secondary | ICD-10-CM

## 2013-12-16 DIAGNOSIS — R000 Tachycardia, unspecified: Secondary | ICD-10-CM

## 2013-12-16 DIAGNOSIS — R0989 Other specified symptoms and signs involving the circulatory and respiratory systems: Secondary | ICD-10-CM

## 2013-12-16 DIAGNOSIS — G4700 Insomnia, unspecified: Secondary | ICD-10-CM

## 2013-12-16 DIAGNOSIS — R0609 Other forms of dyspnea: Secondary | ICD-10-CM

## 2013-12-16 DIAGNOSIS — J449 Chronic obstructive pulmonary disease, unspecified: Secondary | ICD-10-CM

## 2013-12-16 DIAGNOSIS — B1920 Unspecified viral hepatitis C without hepatic coma: Secondary | ICD-10-CM

## 2013-12-16 DIAGNOSIS — R634 Abnormal weight loss: Secondary | ICD-10-CM

## 2013-12-16 LAB — COMPREHENSIVE METABOLIC PANEL (CC13)
ALK PHOS: 378 U/L — AB (ref 40–150)
ALT: 23 U/L (ref 0–55)
AST: 55 U/L — AB (ref 5–34)
Albumin: 1.8 g/dL — ABNORMAL LOW (ref 3.5–5.0)
Anion Gap: 12 mEq/L — ABNORMAL HIGH (ref 3–11)
BILIRUBIN TOTAL: 0.82 mg/dL (ref 0.20–1.20)
BUN: 7.6 mg/dL (ref 7.0–26.0)
CO2: 21 mEq/L — ABNORMAL LOW (ref 22–29)
Calcium: 8.3 mg/dL — ABNORMAL LOW (ref 8.4–10.4)
Chloride: 101 mEq/L (ref 98–109)
Creatinine: 0.5 mg/dL — ABNORMAL LOW (ref 0.7–1.3)
Glucose: 96 mg/dl (ref 70–140)
Potassium: 3.4 mEq/L — ABNORMAL LOW (ref 3.5–5.1)
Sodium: 134 mEq/L — ABNORMAL LOW (ref 136–145)
Total Protein: 6.8 g/dL (ref 6.4–8.3)

## 2013-12-16 LAB — CBC WITH DIFFERENTIAL/PLATELET
BASO%: 0.3 % (ref 0.0–2.0)
Basophils Absolute: 0 10*3/uL (ref 0.0–0.1)
EOS%: 0.6 % (ref 0.0–7.0)
Eosinophils Absolute: 0.1 10*3/uL (ref 0.0–0.5)
HEMATOCRIT: 33.7 % — AB (ref 38.4–49.9)
HGB: 11.1 g/dL — ABNORMAL LOW (ref 13.0–17.1)
LYMPH%: 13 % — AB (ref 14.0–49.0)
MCH: 29.5 pg (ref 27.2–33.4)
MCHC: 32.9 g/dL (ref 32.0–36.0)
MCV: 89.6 fL (ref 79.3–98.0)
MONO#: 2 10*3/uL — ABNORMAL HIGH (ref 0.1–0.9)
MONO%: 12.6 % (ref 0.0–14.0)
NEUT#: 11.5 10*3/uL — ABNORMAL HIGH (ref 1.5–6.5)
NEUT%: 73.5 % (ref 39.0–75.0)
NRBC: 0 % (ref 0–0)
PLATELETS: INCREASED 10*3/uL (ref 140–400)
RBC: 3.76 10*6/uL — ABNORMAL LOW (ref 4.20–5.82)
RDW: 15.9 % — ABNORMAL HIGH (ref 11.0–14.6)
WBC: 15.6 10*3/uL — AB (ref 4.0–10.3)
lymph#: 2 10*3/uL (ref 0.9–3.3)

## 2013-12-16 LAB — HOLD TUBE, BLOOD BANK

## 2013-12-16 MED ORDER — OXYCODONE-ACETAMINOPHEN 5-325 MG PO TABS: 1.0000 | ORAL_TABLET | ORAL | Status: DC | PRN

## 2013-12-16 MED ORDER — ZOLPIDEM TARTRATE 5 MG PO TABS: 5.0000 mg | ORAL_TABLET | Freq: Every evening | ORAL | Status: DC | PRN

## 2013-12-16 MED ORDER — HEPARIN SOD (PORK) LOCK FLUSH 100 UNIT/ML IV SOLN
500.0000 [IU] | Freq: Once | INTRAVENOUS | Status: AC
  Administered 2013-12-16: 500 [IU] via INTRAVENOUS
  Filled 2013-12-16: qty 5

## 2013-12-16 MED ORDER — SODIUM CHLORIDE 0.9 % IV SOLN
INTRAVENOUS | Status: DC
  Administered 2013-12-16: 10:00:00 via INTRAVENOUS

## 2013-12-16 MED ORDER — SODIUM CHLORIDE 0.9 % IJ SOLN
10.0000 mL | INTRAMUSCULAR | Status: DC | PRN
  Administered 2013-12-16: 10 mL via INTRAVENOUS
  Filled 2013-12-16: qty 10

## 2013-12-16 NOTE — Patient Instructions (Signed)
Dehydration, Adult Dehydration is when you lose more fluids from the body than you take in. Vital organs like the kidneys, brain, and heart cannot function without a proper amount of fluids and salt. Any loss of fluids from the body can cause dehydration.  CAUSES   Vomiting.  Diarrhea.  Excessive sweating.  Excessive urine output.  Fever. SYMPTOMS  Mild dehydration  Thirst.  Dry lips.  Slightly dry mouth. Moderate dehydration  Very dry mouth.  Sunken eyes.  Skin does not bounce back quickly when lightly pinched and released.  Dark urine and decreased urine production.  Decreased tear production.  Headache. Severe dehydration  Very dry mouth.  Extreme thirst.  Rapid, weak pulse (more than 100 beats per minute at rest).  Cold hands and feet.  Not able to sweat in spite of heat and temperature.  Rapid breathing.  Blue lips.  Confusion and lethargy.  Difficulty being awakened.  Minimal urine production.  No tears. DIAGNOSIS  Your caregiver will diagnose dehydration based on your symptoms and your exam. Blood and urine tests will help confirm the diagnosis. The diagnostic evaluation should also identify the cause of dehydration. TREATMENT  Treatment of mild or moderate dehydration can often be done at home by increasing the amount of fluids that you drink. It is best to drink small amounts of fluid more often. Drinking too much at one time can make vomiting worse. Refer to the home care instructions below. Severe dehydration needs to be treated at the hospital where you will probably be given intravenous (IV) fluids that contain water and electrolytes. HOME CARE INSTRUCTIONS   Ask your caregiver about specific rehydration instructions.  Drink enough fluids to keep your urine clear or pale yellow.  Drink small amounts frequently if you have nausea and vomiting.  Eat as you normally do.  Avoid:  Foods or drinks high in sugar.  Carbonated  drinks.  Juice.  Extremely hot or cold fluids.  Drinks with caffeine.  Fatty, greasy foods.  Alcohol.  Tobacco.  Overeating.  Gelatin desserts.  Wash your hands well to avoid spreading bacteria and viruses.  Only take over-the-counter or prescription medicines for pain, discomfort, or fever as directed by your caregiver.  Ask your caregiver if you should continue all prescribed and over-the-counter medicines.  Keep all follow-up appointments with your caregiver. SEEK MEDICAL CARE IF:  You have abdominal pain and it increases or stays in one area (localizes).  You have a rash, stiff neck, or severe headache.  You are irritable, sleepy, or difficult to awaken.  You are weak, dizzy, or extremely thirsty. SEEK IMMEDIATE MEDICAL CARE IF:   You are unable to keep fluids down or you get worse despite treatment.  You have frequent episodes of vomiting or diarrhea.  You have blood or green matter (bile) in your vomit.  You have blood in your stool or your stool looks black and tarry.  You have not urinated in 6 to 8 hours, or you have only urinated a small amount of very dark urine.  You have a fever.  You faint. MAKE SURE YOU:   Understand these instructions.  Will watch your condition.  Will get help right away if you are not doing well or get worse. Document Released: 09/18/2005 Document Revised: 12/11/2011 Document Reviewed: 05/08/2011 [employer] Patient Information 2014 [employer].  

## 2013-12-16 NOTE — Progress Notes (Signed)
Patient is receiving IV fluids today.  He continues to have weight loss with weight documented as 139.9 pounds.  He is trying to eat. He is requesting additional samples of vanilla Ensure Plus.  Nutrition diagnosis: Unintended weight loss continues.   Intervention: Provided patient with Ensure Plus samples.  Patient declines offer of coupons.  No knowledge deficit identified.  Provided support and encouragement for patient to increase intake.  Monitoring, evaluation, goals: Patient continues to tolerate oral diet but has had inadequate oral intake leading to continued weight loss.  Next visit: Tuesday, March 24, during chemotherapy.

## 2013-12-16 NOTE — Progress Notes (Signed)
   Tony Barajas    OFFICE PROGRESS NOTE   INTERVAL HISTORY:   Tony Barajas returns for scheduled followup of metastatic colon cancer. He was last treated with FOLFIRI on 11/11/2013. He developed increased diarrhea following chemotherapy. He continues to have right abdominal pain. He reports not eating secondary to the fear of diarrhea. Mild exertional dyspnea. No cough. He missed the last scheduled cycle of chemotherapy secondary to bad weather.  Objective:  Vital signs in last 24 hours:  Blood pressure 94/68, pulse 142, temperature 97 F (36.1 C), temperature source Oral, resp. rate 18, height 5\' 10"  (1.778 m), weight 139 lb 14.4 oz (63.458 kg), SpO2 100.00%.    HEENT: No thrush or ulcers, the mucous membranes are moist Resp: Lungs clear bilaterally Cardio: Regular rate and rhythm, tachycardia GI: No hepatomegaly, distended, tender in the right lower abdomen, no mass Vascular: No leg edema   Portacath/PICC-without erythema  Lab Results:  Lab Results  Component Value Date   WBC 15.6* 12/16/2013   HGB 11.1* 12/16/2013   HCT 33.7* 12/16/2013   MCV 89.6 12/16/2013   PLT Clumped Platelets--Appears Increased 12/16/2013   NEUTROABS 11.5* 12/16/2013      Medications: I have reviewed the patient's current medications.  Assessment/Plan: 1. Hepatic flexure mass with extensive liver metastases. Status post a colonoscopic biopsy on 04/14/2013 confirming at least high-grade glandular dysplasia.  Status post liver biopsy 05/05/2013 confirming metastatic colorectal adenocarcinoma.  Cycle 1 FOLFOX 05/14/2013.  Cycle 2 FOLFOX 05/27/2013.  Cycle 3 FOLFOX 06/10/2013.  Cycle 4 FOLFOX 06/24/2013.  Cycle 5 FOLFOX 07/15/2013.  Restaging CT evaluation 07/25/2013 showed some liver lesions were stable while others had increased in size. There was a generalized decrease in abdominal lymphadenopathy.  Cycle 6 of FOLFOX 07/29/2013.  Cycle 7 of FOLFOX 08/12/2013.  Cycle 8 FOLFOX  09/02/2013.  Cycle 9 FOLFOX (oxaliplatin held) 09/16/2013  CT 09/29/2013 with stable liver metastases, hepatic flexure mass, and adenopathy. Increased thickening at the ileum with surrounding ascites  Cycle 1 of FOLFIRI 09/29/2013  Cycle 2 FOLFIRI 10/14/2013  Cycle 3 FOLFIRI 10/28/2013  Cycle 4 FOLFIRI 11/11/2013 2. Microcytic anemia secondary to #1. He was transfused with PRBCs on 04/30/2013 when the hemoglobin returned at 6.5. He was also given a dose of IV iron. He continues oral iron. The hemoglobin is stable. 3. Hepatitis C positive. 4. Weight loss.  5. COPD. 6. Tobacco use. 7. History of alcohol use. 8. Nausea following cycle 7 FOLFOX-prolonged period, he reports relief of the nausea with Compazine.  9. Abdominal pain-progressive, potentially related to the liver metastases, colon mass, or inflammation at the terminal ileum. The terminal ileum inflammation could be related to vascular compromise.  Disposition:  Tony Barajas has completed 4 treatments with FOLFIRI. The most recent chemotherapy was complicated by increased diarrhea. He has lost weight, the abdomen is distended, and he has persistent right abdominal pain. He will be referred for a restaging CT evaluation prior to considering additional chemotherapy.  Tony Barajas has tachycardia today. He has a history of intermittent tachycardia. I have a low clinical suspicion for a pulmonary embolism. He will receive intravenous fluids then have repeat vital signs today.  Tony Barajas will be scheduled for an office visit in one week.   Betsy Coder, MD  12/16/2013  6:08 PM

## 2013-12-16 NOTE — Telephone Encounter (Signed)
gv pt appt schedule for march/april [facility] will call pt w/ct appt - pt aware.

## 2013-12-17 ENCOUNTER — Telehealth: Payer: Self-pay | Admitting: *Deleted

## 2013-12-17 ENCOUNTER — Other Ambulatory Visit: Payer: Self-pay | Admitting: *Deleted

## 2013-12-17 NOTE — Telephone Encounter (Signed)
Called patient at request of Dr. Benay Spice to come to [facility] after his scan tomorrow so his BP can be rechecked. He understands and agrees.

## 2013-12-17 NOTE — Progress Notes (Signed)
Dr. Benay Spice wants BP recheck on 12/18/13. POF entered.

## 2013-12-18 ENCOUNTER — Encounter: Payer: Self-pay | Admitting: *Deleted

## 2013-12-18 ENCOUNTER — Ambulatory Visit (HOSPITAL_COMMUNITY)
Admission: RE | Admit: 2013-12-18 | Discharge: 2013-12-18 | Disposition: A | Discharge status: Home / Self Care | Payer: 59 | Source: Ambulatory Visit | Attending: Oncology | Admitting: Oncology

## 2013-12-18 DIAGNOSIS — R599 Enlarged lymph nodes, unspecified: Secondary | ICD-10-CM | POA: Insufficient documentation

## 2013-12-18 DIAGNOSIS — I749 Embolism and thrombosis of unspecified artery: Secondary | ICD-10-CM | POA: Insufficient documentation

## 2013-12-18 DIAGNOSIS — C787 Secondary malignant neoplasm of liver and intrahepatic bile duct: Secondary | ICD-10-CM | POA: Insufficient documentation

## 2013-12-18 DIAGNOSIS — C189 Malignant neoplasm of colon, unspecified: Secondary | ICD-10-CM

## 2013-12-18 DIAGNOSIS — J90 Pleural effusion, not elsewhere classified: Secondary | ICD-10-CM | POA: Insufficient documentation

## 2013-12-18 DIAGNOSIS — C183 Malignant neoplasm of hepatic flexure: Secondary | ICD-10-CM | POA: Insufficient documentation

## 2013-12-18 MED ORDER — IOHEXOL 300 MG/ML  SOLN
100.0000 mL | Freq: Once | INTRAMUSCULAR | Status: AC | PRN
  Administered 2013-12-18: 100 mL via INTRAVENOUS

## 2013-12-23 ENCOUNTER — Encounter: Payer: 59 | Admitting: Nutrition

## 2013-12-23 ENCOUNTER — Ambulatory Visit (HOSPITAL_BASED_OUTPATIENT_CLINIC_OR_DEPARTMENT_OTHER): Payer: 59 | Admitting: Nurse Practitioner

## 2013-12-23 ENCOUNTER — Inpatient Hospital Stay: Payer: 59

## 2013-12-23 ENCOUNTER — Other Ambulatory Visit (HOSPITAL_BASED_OUTPATIENT_CLINIC_OR_DEPARTMENT_OTHER): Payer: 59

## 2013-12-23 ENCOUNTER — Telehealth: Payer: Self-pay | Admitting: Oncology

## 2013-12-23 VITALS — BP 110/74 | HR 139 | Temp 97.3°F | Resp 19 | Ht 70.0 in | Wt 148.7 lb

## 2013-12-23 DIAGNOSIS — C183 Malignant neoplasm of hepatic flexure: Secondary | ICD-10-CM

## 2013-12-23 DIAGNOSIS — C787 Secondary malignant neoplasm of liver and intrahepatic bile duct: Secondary | ICD-10-CM

## 2013-12-23 DIAGNOSIS — C189 Malignant neoplasm of colon, unspecified: Secondary | ICD-10-CM

## 2013-12-23 DIAGNOSIS — D509 Iron deficiency anemia, unspecified: Secondary | ICD-10-CM

## 2013-12-23 DIAGNOSIS — R109 Unspecified abdominal pain: Secondary | ICD-10-CM

## 2013-12-23 LAB — CBC WITH DIFFERENTIAL/PLATELET
BASO%: 0.3 % (ref 0.0–2.0)
BASOS ABS: 0 10*3/uL (ref 0.0–0.1)
EOS ABS: 0.2 10*3/uL (ref 0.0–0.5)
EOS%: 1.3 % (ref 0.0–7.0)
HEMATOCRIT: 34.5 % — AB (ref 38.4–49.9)
HEMOGLOBIN: 11.3 g/dL — AB (ref 13.0–17.1)
LYMPH%: 16.3 % (ref 14.0–49.0)
MCH: 29.6 pg (ref 27.2–33.4)
MCHC: 32.8 g/dL (ref 32.0–36.0)
MCV: 90.3 fL (ref 79.3–98.0)
MONO#: 1.3 10*3/uL — ABNORMAL HIGH (ref 0.1–0.9)
MONO%: 8.3 % (ref 0.0–14.0)
NEUT#: 11.1 10*3/uL — ABNORMAL HIGH (ref 1.5–6.5)
NEUT%: 73.8 % (ref 39.0–75.0)
PLATELETS: 631 10*3/uL — AB (ref 140–400)
RBC: 3.82 10*6/uL — ABNORMAL LOW (ref 4.20–5.82)
RDW: 16.5 % — ABNORMAL HIGH (ref 11.0–14.6)
WBC: 15 10*3/uL — ABNORMAL HIGH (ref 4.0–10.3)
lymph#: 2.5 10*3/uL (ref 0.9–3.3)

## 2013-12-23 NOTE — Telephone Encounter (Signed)
gv adn pritned appt sched and avs for pt for March adn April.....sed added tx.

## 2013-12-23 NOTE — Progress Notes (Addendum)
OFFICE PROGRESS NOTE  Interval history:  [name] returns for followup of metastatic [name]. He continues to have right-sided abdominal pain. He notes he is taking the Percocet more frequently. He denies nausea/vomiting. Bowels are moving. He denies diarrhea. Stable dyspnea on exertion. No cough or fever.   Objective: Filed Vitals:   12/23/13 0933  BP: 110/74  Pulse: 139  Temp: 97.3 F (36.3 C)  Resp: 19   No thrush or ulcerations. Lungs are clear. Breath sounds diminished at the left lung base. Heart is regular, tachycardic. Port-A-Cath site is without erythema. Abdomen is distended. He appears to have ascites. Tender at the right low abdomen. No leg edema.   Lab Results: Lab Results  Component Value Date   WBC 15.0* 12/23/2013   HGB 11.3* 12/23/2013   HCT 34.5* 12/23/2013   MCV 90.3 12/23/2013   PLT 631* 12/23/2013   NEUTROABS 11.1* 12/23/2013    Chemistry:    Chemistry      Component Value Date/Time   NA 134* 12/16/2013 0820   NA 139 03/14/2013 1023   K 3.4* 12/16/2013 0820   K 4.0 03/14/2013 1023   CL 104 03/14/2013 1023   CO2 21* 12/16/2013 0820   CO2 22 03/14/2013 1023   BUN 7.6 12/16/2013 0820   BUN 10 03/14/2013 1023   CREATININE 0.5* 12/16/2013 0820   CREATININE 0.7 03/14/2013 1023      Component Value Date/Time   CALCIUM 8.3* 12/16/2013 0820   CALCIUM 9.1 03/14/2013 1023   ALKPHOS 378* 12/16/2013 0820   ALKPHOS 82 03/14/2013 1023   AST 55* 12/16/2013 0820   AST 33 03/14/2013 1023   ALT 23 12/16/2013 0820   ALT 26 03/14/2013 1023   BILITOT 0.82 12/16/2013 0820   BILITOT 0.5 03/14/2013 1023       Studies/Results: Ct Abdomen Pelvis W Contrast  12/18/2013   CLINICAL DATA:  [name] with liver metastasis.  EXAM: CT ABDOMEN AND PELVIS WITH CONTRAST  TECHNIQUE: Multidetector CT imaging of the abdomen and pelvis was performed using the standard protocol following bolus administration of intravenous contrast.  CONTRAST:  174m OMNIPAQUE IOHEXOL 300 MG/ML  SOLN   COMPARISON:  09/29/2013.  FINDINGS: The lung bases demonstrate A small left pleural effusion with overlying atelectasis. There is also mild right lower lobe atelectasis. No worrisome pulmonary nodules. Small amount of fluid is noted in the distal esophagus. No mass is identified. Coronary artery calcifications are again noted.  The left hepatic lobe liver mass is relatively stable measuring 12 x 12 cm. The right hepatic lobe lesion on image number 34 is also stable measuring a maximum of 3.2 cm. The right hepatic lobe lesion near the dome of the liver on image number 14 is slightly smaller (4.2 x 3.7 versus 3.8 x 3.2 cm) and appears somewhat necrotic when compared to the prior study. No new lesions are identified.  The gallbladder is contracted. No common bowel duct dilatation. The pancreas is unremarkable and stable. The spleen is normal in size. No focal lesions. The adrenal glands and kidneys are unremarkable and stable.  Progression of abdominal and pelvic ascites is noted. There is also mesenteric edema and enlarging mesenteric and retroperitoneal lymph nodes. Anterior aortocaval nodal mass on image number 46 measures 24.5 x 19.5 mm. It previously measured 22 x 14 mm. Adenopathy the in the gastrohepatic ligament has increased. There is also significant enlarging celiac axis lymphadenopathy and portal adenopathy. The chain of celiac axis nodes on image number 35 measures  6.4 x 1.8 cm and previously measured 4.9 x 1.7 cm.  The aorta and branch vessels are patent. 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  The right-sided hepatic flexure [name] is progressive and further invasion of the liver is noted.  The bladder, prostate gland and seminal vesicles are unremarkable. There is a large amount of free pelvic fluid but no pelvic mass or lymphadenopathy. No inguinal mass or adenopathy. Suspect prior deep venous thrombosis of the common femoral veins which appear recannulized.  No significant bony findings. Stable  anterior abdominal wall hernia containing fluid.  IMPRESSION: 1. Overall stable hepatic metastatic disease.  No new lesions. 2. Progressive abdominal lymphadenopathy. 3. Progressive hepatic flexure [name] and slightly progressive direct liver invasion. 4. Stable SMV thrombosis. 5. Interval development of large volume abdominal/pelvic ascites. 6. Left pleural effusion and bibasilar atelectasis.   Electronically Signed   By: Kalman Jewels M.D.   On: 12/18/2013 12:31    Medications: I have reviewed the patient's current medications.  Assessment/Plan: 1. Hepatic flexure mass with extensive liver metastases. Status post a colonoscopic biopsy on 04/14/2013 confirming at least high-grade glandular dysplasia.  Status post liver biopsy 05/05/2013 confirming metastatic colorectal adenocarcinoma.  No K-ras mutation on extended testing. Cycle 1 FOLFOX 05/14/2013.  Cycle 2 FOLFOX 05/27/2013.  Cycle 3 FOLFOX 06/10/2013.  Cycle 4 FOLFOX 06/24/2013.  Cycle 5 FOLFOX 07/15/2013.  Restaging CT evaluation 07/25/2013 showed some liver lesions were stable while others had increased in size. There was a generalized decrease in abdominal lymphadenopathy.  Cycle 6 of FOLFOX 07/29/2013.  Cycle 7 of FOLFOX 08/12/2013.  Cycle 8 FOLFOX 09/02/2013.  Cycle 9 FOLFOX (oxaliplatin held) 09/16/2013  CT 09/29/2013 with stable liver metastases, hepatic flexure mass, and adenopathy. Increased thickening at the ileum with surrounding ascites  Cycle 1 of FOLFIRI 09/29/2013  Cycle 2 FOLFIRI 10/14/2013  Cycle 3 FOLFIRI 10/28/2013  Cycle 4 FOLFIRI 11/11/2013. Restaging CT evaluation 12/18/2013 showed overall stable hepatic metastatic disease; no new lesions. Progressive abdominal lymphadenopathy; progressive hepatic flexure [name] and slightly progressive direct liver invasion; stable SMV thrombosis; interval development of large volume abdominal/pelvic ascites; left pleural effusion and bibasilar  atelectasis. 2. Microcytic anemia secondary to #1. He was transfused with PRBCs on 04/30/2013 when the hemoglobin returned at 6.5. He was also given a dose of IV iron. He continues oral iron. The hemoglobin is stable. 3. Hepatitis C positive. 4. Weight loss.  5. COPD. 6. Tobacco use. 7. History of alcohol use. 8. Nausea following cycle 7 FOLFOX-prolonged period, he reports relief of the nausea with Compazine.  9. Abdominal pain-progressive, potentially related to the liver metastases, colon mass, or inflammation at the terminal ileum. The terminal ileum inflammation could be related to vascular compromise.  Dispositon-there is evidence of progression on the restaging CT evaluation. Dr. Benay Spice recommends continuation of irinotecan with the addition of Panitumumab on a 2 week schedule. We reviewed potential toxicities associated with Panitumumab including allergic reaction, skin rash, diarrhea. Tony Barajas is agreeable to proceed. He will return to begin cycle 1 on 12/30/2013.  We discussed a diagnostic/therapeutic paracentesis. He does not wish to undergo this at present.  We will see him in followup prior to cycle 2 on 01/13/2014. He will contact the office in the interim with any problems.  Patient seen with Dr. Benay Spice.   Ned Card ANP/GNP-BC   This was a shared visit with Ned Card. There is clinical and CT evidence of disease progression. I discussed the CT findings and treatment options with Mr.  Ruthann Barajas. We will discontinue FOLFIRI. The plan is to begin a trial of irinotecan/panitumumab. I reviewed the potential toxicities associated with this regimen and he agrees to proceed. He declined a palliative therapeutic/diagnostic paracentesis.  Julieanne Manson, M.D.

## 2013-12-23 NOTE — Patient Instructions (Signed)
Panitumumab Solution for Injection  What is this medicine?  PANITUMUMAB (pan i TOOM ue mab) is a chemotherapy drug. It targets a specific protein within cancer cells and stops the cells from growing. It is used to treat colorectal cancer.  This medicine may be used for other purposes; ask your health care provider or pharmacist if you have questions.  COMMON BRAND NAME(S): Vectibix  What should I tell my health care provider before I take this medicine?  They need to know if you have any of these conditions:  -lung disease, especially lung fibrosis  -skin conditions or sensitivity  -an unusual or allergic reaction to panitumumab, mouse proteins, other medicines, foods, dyes, or preservatives  -pregnant or trying to get pregnant  -breast-feeding  How should I use this medicine?  This drug is given as an infusion into a vein. It is administered in a hospital or clinic by a specially trained health care professional.  Talk to your pediatrician regarding the use of this medicine in children. Special care may be needed.  Overdosage: If you think you have taken too much of this medicine contact a poison control center or emergency room at once.  NOTE: This medicine is only for you. Do not share this medicine with others.  What if I miss a dose?  It is important not to miss your dose. Call your doctor or health care professional if you are unable to keep an appointment.  What may interact with this medicine?  -some medicines for cancer  This list may not describe all possible interactions. Give your health care provider a list of all the medicines, herbs, non-prescription drugs, or dietary supplements you use. Also tell them if you smoke, drink alcohol, or use illegal drugs. Some items may interact with your medicine.  What should I watch for while using this medicine?  Visit your doctor for checks on your progress. This drug may make you feel generally unwell. This is not uncommon, as chemotherapy can affect healthy cells  as well as cancer cells. Report any side effects. Continue your course of treatment even though you feel ill unless your doctor tells you to stop.  This medicine can make you more sensitive to the sun. Keep out of the sun while receiving this medicine and for 2 months after the last dose. If you cannot avoid being in the sun, wear protective clothing and use sunscreen. Do not use sun lamps or tanning beds/booths.  In some cases, you may be given additional medicines to help with side effects. Follow all directions for their use.  Call your doctor or health care professional for advice if you get a fever, chills or sore throat, or other symptoms of a cold or flu. Do not treat yourself. This drug decreases your body's ability to fight infections. Try to avoid being around people who are sick.  Avoid taking products that contain aspirin, acetaminophen, ibuprofen, naproxen, or ketoprofen unless instructed by your doctor. These medicines may hide a fever.  Do not become pregnant while taking this medicine and for 6 months after the last dose. Women should inform their doctor if they wish to become pregnant or think they might be pregnant. Men should not father a child while taking this medicine and for 6 months after the last dose. There is a potential for serious side effects to an unborn child. Talk to your health care professional or pharmacist for more information. Do not breast-feed an infant while taking this medicine.  What   side effects may I notice from receiving this medicine?  Side effects that you should report to your doctor or health care professional as soon as possible:  -allergic reactions like skin rash, itching or hives, swelling of the face, lips, or tongue  -breathing problems  -changes in vision  -fast, irregular heartbeat  -feeling faint or lightheaded, falls  -fever, chills  -mouth sores  -swelling of the ankles, feet, hands  -unusually weak or tired  Side effects that usually do not require  medical attention (report to your doctor or health care professional if they continue or are bothersome):  -changes in skin like acne, cracks, skin dryness  -constipation  -diarrhea  -eyelash growth  -headache  -nail changes  -nausea, vomiting  -stomach upset  This list may not describe all possible side effects. Call your doctor for medical advice about side effects. You may report side effects to FDA at 1-800-FDA-1088.  Where should I keep my medicine?  This drug is given in a hospital or clinic and will not be stored at home.  NOTE: This sheet is a summary. It may not cover all possible information. If you have questions about this medicine, talk to your doctor, pharmacist, or health care provider.   2014, [employer]. (2011-02-14 11:00:12)

## 2013-12-28 ENCOUNTER — Other Ambulatory Visit: Payer: Self-pay | Admitting: Oncology

## 2013-12-29 ENCOUNTER — Other Ambulatory Visit: Payer: Self-pay | Admitting: *Deleted

## 2013-12-29 MED ORDER — MINOCYCLINE HCL 100 MG PO CAPS: 100.0000 mg | ORAL_CAPSULE | Freq: Two times a day (BID) | ORAL | Status: DC

## 2013-12-29 NOTE — Telephone Encounter (Signed)
Left vm for pt to call office re: starting Minocycline 100 mg BID per Dr. Benay Spice.

## 2013-12-30 ENCOUNTER — Other Ambulatory Visit (HOSPITAL_BASED_OUTPATIENT_CLINIC_OR_DEPARTMENT_OTHER): Payer: 59

## 2013-12-30 ENCOUNTER — Ambulatory Visit: Payer: 59 | Admitting: Nutrition

## 2013-12-30 ENCOUNTER — Ambulatory Visit (HOSPITAL_BASED_OUTPATIENT_CLINIC_OR_DEPARTMENT_OTHER): Payer: 59

## 2013-12-30 VITALS — BP 112/77 | HR 136 | Temp 97.1°F | Resp 18

## 2013-12-30 DIAGNOSIS — C189 Malignant neoplasm of colon, unspecified: Secondary | ICD-10-CM

## 2013-12-30 DIAGNOSIS — C183 Malignant neoplasm of hepatic flexure: Secondary | ICD-10-CM

## 2013-12-30 DIAGNOSIS — D509 Iron deficiency anemia, unspecified: Secondary | ICD-10-CM

## 2013-12-30 DIAGNOSIS — Z5111 Encounter for antineoplastic chemotherapy: Secondary | ICD-10-CM

## 2013-12-30 DIAGNOSIS — C787 Secondary malignant neoplasm of liver and intrahepatic bile duct: Secondary | ICD-10-CM

## 2013-12-30 DIAGNOSIS — Z5112 Encounter for antineoplastic immunotherapy: Secondary | ICD-10-CM

## 2013-12-30 LAB — MAGNESIUM (CC13): MAGNESIUM: 1.8 mg/dL (ref 1.5–2.5)

## 2013-12-30 LAB — CBC WITH DIFFERENTIAL/PLATELET
BASO%: 0.2 % (ref 0.0–2.0)
Basophils Absolute: 0 10*3/uL (ref 0.0–0.1)
EOS%: 0.9 % (ref 0.0–7.0)
Eosinophils Absolute: 0.1 10*3/uL (ref 0.0–0.5)
HEMATOCRIT: 33.8 % — AB (ref 38.4–49.9)
HEMOGLOBIN: 10.7 g/dL — AB (ref 13.0–17.1)
LYMPH#: 2.2 10*3/uL (ref 0.9–3.3)
LYMPH%: 19.8 % (ref 14.0–49.0)
MCH: 28.6 pg (ref 27.2–33.4)
MCHC: 31.7 g/dL — AB (ref 32.0–36.0)
MCV: 90.4 fL (ref 79.3–98.0)
MONO#: 1.3 10*3/uL — AB (ref 0.1–0.9)
MONO%: 11.8 % (ref 0.0–14.0)
NEUT#: 7.5 10*3/uL — ABNORMAL HIGH (ref 1.5–6.5)
NEUT%: 67.3 % (ref 39.0–75.0)
Platelets: 476 10*3/uL — ABNORMAL HIGH (ref 140–400)
RBC: 3.74 10*6/uL — ABNORMAL LOW (ref 4.20–5.82)
RDW: 16.6 % — AB (ref 11.0–14.6)
WBC: 11.1 10*3/uL — AB (ref 4.0–10.3)
nRBC: 0 % (ref 0–0)

## 2013-12-30 LAB — COMPREHENSIVE METABOLIC PANEL (CC13)
ALK PHOS: 217 U/L — AB (ref 40–150)
ALT: 7 U/L (ref 0–55)
AST: 22 U/L (ref 5–34)
Albumin: 1.7 g/dL — ABNORMAL LOW (ref 3.5–5.0)
Anion Gap: 10 mEq/L (ref 3–11)
BUN: 7 mg/dL (ref 7.0–26.0)
CO2: 21 mEq/L — ABNORMAL LOW (ref 22–29)
CREATININE: 0.6 mg/dL — AB (ref 0.7–1.3)
Calcium: 8.2 mg/dL — ABNORMAL LOW (ref 8.4–10.4)
Chloride: 109 mEq/L (ref 98–109)
Glucose: 86 mg/dl (ref 70–140)
Potassium: 3.8 mEq/L (ref 3.5–5.1)
Sodium: 139 mEq/L (ref 136–145)
Total Bilirubin: 0.41 mg/dL (ref 0.20–1.20)
Total Protein: 7 g/dL (ref 6.4–8.3)

## 2013-12-30 MED ORDER — DEXAMETHASONE SODIUM PHOSPHATE 10 MG/ML IJ SOLN
INTRAMUSCULAR | Status: AC
  Filled 2013-12-30: qty 1

## 2013-12-30 MED ORDER — PALONOSETRON HCL INJECTION 0.25 MG/5ML
0.2500 mg | Freq: Once | INTRAVENOUS | Status: AC
  Administered 2013-12-30: 0.25 mg via INTRAVENOUS

## 2013-12-30 MED ORDER — PALONOSETRON HCL INJECTION 0.25 MG/5ML
INTRAVENOUS | Status: AC
  Filled 2013-12-30: qty 5

## 2013-12-30 MED ORDER — SODIUM CHLORIDE 0.9 % IJ SOLN
10.0000 mL | INTRAMUSCULAR | Status: DC | PRN
  Administered 2013-12-30: 10 mL
  Filled 2013-12-30: qty 10

## 2013-12-30 MED ORDER — ATROPINE SULFATE 1 MG/ML IJ SOLN
0.5000 mg | Freq: Once | INTRAMUSCULAR | Status: AC | PRN
  Administered 2013-12-30: 0.5 mg via INTRAVENOUS

## 2013-12-30 MED ORDER — HEPARIN SOD (PORK) LOCK FLUSH 100 UNIT/ML IV SOLN
500.0000 [IU] | Freq: Once | INTRAVENOUS | Status: AC | PRN
  Administered 2013-12-30: 500 [IU]
  Filled 2013-12-30: qty 5

## 2013-12-30 MED ORDER — SODIUM CHLORIDE 0.9 % IV SOLN
Freq: Once | INTRAVENOUS | Status: AC
  Administered 2013-12-30: 09:00:00 via INTRAVENOUS

## 2013-12-30 MED ORDER — DEXAMETHASONE SODIUM PHOSPHATE 10 MG/ML IJ SOLN
10.0000 mg | Freq: Once | INTRAMUSCULAR | Status: AC
  Administered 2013-12-30: 10 mg via INTRAVENOUS

## 2013-12-30 MED ORDER — PANITUMUMAB CHEMO INJECTION 100 MG/5ML
6.0000 mg/kg | Freq: Once | INTRAVENOUS | Status: AC
  Administered 2013-12-30: 400 mg via INTRAVENOUS
  Filled 2013-12-30: qty 20

## 2013-12-30 MED ORDER — IRINOTECAN HCL CHEMO INJECTION 100 MG/5ML
140.0000 mg/m2 | Freq: Once | INTRAVENOUS | Status: AC
  Administered 2013-12-30: 254 mg via INTRAVENOUS
  Filled 2013-12-30: qty 12.7

## 2013-12-30 MED ORDER — ATROPINE SULFATE 1 MG/ML IJ SOLN
INTRAMUSCULAR | Status: AC
  Filled 2013-12-30: qty 1

## 2013-12-30 NOTE — Progress Notes (Signed)
Pt has been notified of new ABX to begin today, Minocycline 1 capsule twice daily until discontinued. He is aware of the dosage and that it is indicated for his new therapy, Vectibix, and potential skin rash. Patient verbalizes understanding.

## 2013-12-30 NOTE — Patient Instructions (Addendum)
Your doctor has prescribed an antibiotic for you, Minocin (Minocycline) 100 mg. Please take 1 capsule twice daily until discontinued. It has been called in to your pharmacy. Begin today.   [facility] Discharge Instructions for Patients Receiving Chemotherapy  Today you received the following chemotherapy agents: Vectibix and Irinotecan   To help prevent nausea and vomiting after your treatment, we encourage you to take your nausea medication as prescribed.    If you develop nausea and vomiting that is not controlled by your nausea medication, call the clinic.   BELOW ARE SYMPTOMS THAT SHOULD BE REPORTED IMMEDIATELY:  *FEVER GREATER THAN 100.5 F  *CHILLS WITH OR WITHOUT FEVER  NAUSEA AND VOMITING THAT IS NOT CONTROLLED WITH YOUR NAUSEA MEDICATION  *UNUSUAL SHORTNESS OF BREATH  *UNUSUAL BRUISING OR BLEEDING  TENDERNESS IN MOUTH AND THROAT WITH OR WITHOUT PRESENCE OF ULCERS  *URINARY PROBLEMS  *BOWEL PROBLEMS  UNUSUAL RASH Items with * indicate a potential emergency and should be followed up as soon as possible.  Feel free to call the clinic you have any questions or concerns. The clinic phone number is [phone].   Irinotecan injection What is this medicine? IRINOTECAN (ir in oh TEE kan ) is a chemotherapy drug. It is used to treat colon and rectal cancer. This medicine may be used for other purposes; ask your health care provider or pharmacist if you have questions. COMMON BRAND NAME(S): Camptosar What should I tell my health care provider before I take this medicine? They need to know if you have any of these conditions: -blood disorders -dehydration -diarrhea -infection (especially a virus infection such as chickenpox, cold sores, or herpes) -liver disease -low blood counts, like low white cell, platelet, or red cell counts -recent or ongoing radiation therapy -an unusual or allergic reaction to irinotecan, sorbitol, other chemotherapy, other  medicines, foods, dyes, or preservatives -pregnant or trying to get pregnant -breast-feeding How should I use this medicine? This drug is given as an infusion into a vein. It is administered in a hospital or clinic by a specially trained health care professional. Talk to your pediatrician regarding the use of this medicine in children. Special care may be needed. Overdosage: If you think you have taken too much of this medicine contact a poison control center or emergency room at once. NOTE: This medicine is only for you. Do not share this medicine with others. What if I miss a dose? It is important not to miss your dose. Call your doctor or health care professional if you are unable to keep an appointment. What may interact with this medicine? Do not take this medicine with any of the following medications: -atazanavir -ketoconazole -St. John's Wort This medicine may also interact with the following medications: -dexamethasone -diuretics -laxatives -medicines for seizures like carbamazepine, mephobarbital, phenobarbital, phenytoin, primidone -medicines to increase blood counts like filgrastim, pegfilgrastim, sargramostim -prochlorperazine -vaccines This list may not describe all possible interactions. Give your health care provider a list of all the medicines, herbs, non-prescription drugs, or dietary supplements you use. Also tell them if you smoke, drink alcohol, or use illegal drugs. Some items may interact with your medicine. What should I watch for while using this medicine? Your condition will be monitored carefully while you are receiving this medicine. You will need important blood work done while you are taking this medicine. This drug may make you feel generally unwell. This is not uncommon, as chemotherapy can affect healthy cells as well as cancer cells. Report  any side effects. Continue your course of treatment even though you feel ill unless your doctor tells you to stop. In  some cases, you may be given additional medicines to help with side effects. Follow all directions for their use. You may get drowsy or dizzy. Do not drive, use machinery, or do anything that needs mental alertness until you know how this medicine affects you. Do not stand or sit up quickly, especially if you are an older patient. This reduces the risk of dizzy or fainting spells. Call your doctor or health care professional for advice if you get a fever, chills or sore throat, or other symptoms of a cold or flu. Do not treat yourself. This drug decreases your body's ability to fight infections. Try to avoid being around people who are sick. This medicine may increase your risk to bruise or bleed. Call your doctor or health care professional if you notice any unusual bleeding. Be careful brushing and flossing your teeth or using a toothpick because you may get an infection or bleed more easily. If you have any dental work done, tell your dentist you are receiving this medicine. Avoid taking products that contain aspirin, acetaminophen, ibuprofen, naproxen, or ketoprofen unless instructed by your doctor. These medicines may hide a fever. Do not become pregnant while taking this medicine. Women should inform their doctor if they wish to become pregnant or think they might be pregnant. There is a potential for serious side effects to an unborn child. Talk to your health care professional or pharmacist for more information. Do not breast-feed an infant while taking this medicine. What side effects may I notice from receiving this medicine? Side effects that you should report to your doctor or health care professional as soon as possible: -allergic reactions like skin rash, itching or hives, swelling of the face, lips, or tongue -low blood counts - this medicine may decrease the number of white blood cells, red blood cells and platelets. You may be at increased risk for infections and bleeding. -signs of  infection - fever or chills, cough, sore throat, pain or difficulty passing urine -signs of decreased platelets or bleeding - bruising, pinpoint red spots on the skin, black, tarry stools, blood in the urine -signs of decreased red blood cells - unusually weak or tired, fainting spells, lightheadedness -breathing problems -chest pain -diarrhea -feeling faint or lightheaded, falls -flushing, runny nose, sweating during infusion -mouth sores or pain -pain, swelling, redness or irritation where injected -pain, swelling, warmth in the leg -pain, tingling, numbness in the hands or feet -problems with balance, talking, walking -stomach cramps, pain -trouble passing urine or change in the amount of urine -vomiting as to be unable to hold down drinks or food -yellowing of the eyes or skin Side effects that usually do not require medical attention (report to your doctor or health care professional if they continue or are bothersome): -constipation -hair loss -headache -loss of appetite -nausea, vomiting -stomach upset This list may not describe all possible side effects. Call your doctor for medical advice about side effects. You may report side effects to FDA at 1-800-FDA-1088. Where should I keep my medicine? This drug is given in a hospital or clinic and will not be stored at home. NOTE: This sheet is a summary. It may not cover all possible information. If you have questions about this medicine, talk to your doctor, pharmacist, or health care provider.  2014, [employer]. (2008-02-04 16:29:12) Panitumumab Solution for Injection What is this medicine?  PANITUMUMAB (pan i TOOM ue mab) is a chemotherapy drug. It targets a specific protein within cancer cells and stops the cells from growing. It is used to treat colorectal cancer. This medicine may be used for other purposes; ask your health care provider or pharmacist if you have questions. COMMON BRAND NAME(S): Vectibix What  should I tell my health care provider before I take this medicine? They need to know if you have any of these conditions: -lung disease, especially lung fibrosis -skin conditions or sensitivity -an unusual or allergic reaction to panitumumab, mouse proteins, other medicines, foods, dyes, or preservatives -pregnant or trying to get pregnant -breast-feeding How should I use this medicine? This drug is given as an infusion into a vein. It is administered in a hospital or clinic by a specially trained health care professional. Talk to your pediatrician regarding the use of this medicine in children. Special care may be needed. Overdosage: If you think you have taken too much of this medicine contact a poison control center or emergency room at once. NOTE: This medicine is only for you. Do not share this medicine with others. What if I miss a dose? It is important not to miss your dose. Call your doctor or health care professional if you are unable to keep an appointment. What may interact with this medicine? -some medicines for cancer This list may not describe all possible interactions. Give your health care provider a list of all the medicines, herbs, non-prescription drugs, or dietary supplements you use. Also tell them if you smoke, drink alcohol, or use illegal drugs. Some items may interact with your medicine. What should I watch for while using this medicine? Visit your doctor for checks on your progress. This drug may make you feel generally unwell. This is not uncommon, as chemotherapy can affect healthy cells as well as cancer cells. Report any side effects. Continue your course of treatment even though you feel ill unless your doctor tells you to stop. This medicine can make you more sensitive to the sun. Keep out of the sun while receiving this medicine and for 2 months after the last dose. If you cannot avoid being in the sun, wear protective clothing and use sunscreen. Do not use sun  lamps or tanning beds/booths. In some cases, you may be given additional medicines to help with side effects. Follow all directions for their use. Call your doctor or health care professional for advice if you get a fever, chills or sore throat, or other symptoms of a cold or flu. Do not treat yourself. This drug decreases your body's ability to fight infections. Try to avoid being around people who are sick. Avoid taking products that contain aspirin, acetaminophen, ibuprofen, naproxen, or ketoprofen unless instructed by your doctor. These medicines may hide a fever. Do not become pregnant while taking this medicine and for 6 months after the last dose. Women should inform their doctor if they wish to become pregnant or think they might be pregnant. Men should not father a child while taking this medicine and for 6 months after the last dose. There is a potential for serious side effects to an unborn child. Talk to your health care professional or pharmacist for more information. Do not breast-feed an infant while taking this medicine. What side effects may I notice from receiving this medicine? Side effects that you should report to your doctor or health care professional as soon as possible: -allergic reactions like skin rash, itching or hives, swelling of  the face, lips, or tongue -breathing problems -changes in vision -fast, irregular heartbeat -feeling faint or lightheaded, falls -fever, chills -mouth sores -swelling of the ankles, feet, hands -unusually weak or tired Side effects that usually do not require medical attention (report to your doctor or health care professional if they continue or are bothersome): -changes in skin like acne, cracks, skin dryness -constipation -diarrhea -eyelash growth -headache -nail changes -nausea, vomiting -stomach upset This list may not describe all possible side effects. Call your doctor for medical advice about side effects. You may report side  effects to FDA at 1-800-FDA-1088. Where should I keep my medicine? This drug is given in a hospital or clinic and will not be stored at home. NOTE: This sheet is a summary. It may not cover all possible information. If you have questions about this medicine, talk to your doctor, pharmacist, or health care provider.  2014, [employer]. (2011-02-14 11:00:12)

## 2013-12-30 NOTE — Progress Notes (Signed)
Nutrition followup completed with patient.  Patient is receiving a new chemotherapy regimen. Weight was documented as 148.7 pounds March 24.  Increased from 139.9 pounds.  Patient continues to drink [employer], however, is unable to afford to purchase.  Patient verbalizes that if this treatment doesn't work he will be switching to comfort care.  Nutrition diagnosis: Unintended weight loss has improved.  Intervention: Provided support and encouragement for patient.  Encouraged adequate intake with vanilla Ensure Plus as tolerated.  Provided patient with samples of Vanilla Ensure Plus.  Teach back method used.  Monitoring, evaluation, goals: Patient will tolerate adequate calories and protein for minimal weight loss.  Next visit: Tuesday, April 14, during chemotherapy.

## 2013-12-31 ENCOUNTER — Telehealth: Payer: Self-pay | Admitting: *Deleted

## 2013-12-31 NOTE — Telephone Encounter (Signed)
No questions or concerns except, "bad taste in my mouth". Encouraged him to push fluids, rinse with baking soda/water rinses. Use plastic utensils to eat. Suck on hard candy or chew gum.

## 2014-01-06 ENCOUNTER — Inpatient Hospital Stay: Payer: 59

## 2014-01-06 ENCOUNTER — Encounter: Payer: 59 | Admitting: Nutrition

## 2014-01-08 ENCOUNTER — Telehealth: Payer: Self-pay | Admitting: *Deleted

## 2014-01-08 DIAGNOSIS — C189 Malignant neoplasm of colon, unspecified: Secondary | ICD-10-CM

## 2014-01-08 DIAGNOSIS — C787 Secondary malignant neoplasm of liver and intrahepatic bile duct: Principal | ICD-10-CM

## 2014-01-08 NOTE — Telephone Encounter (Signed)
Pt called stating he would like a paracentesis.  He is feeling more uncomfortable and more SOB.  He is requesting to have it done before seeing Dr Benay Spice on 4/14.  Per Dr Benay Spice, okay to do US paracentesis diagnostic and therapeutic, send fluid to cytology.  Pt is aware of date and time of paracentesis on 01/09/14 at 8:30am.  SLJ

## 2014-01-09 ENCOUNTER — Ambulatory Visit (HOSPITAL_COMMUNITY)
Admission: RE | Admit: 2014-01-09 | Discharge: 2014-01-09 | Disposition: A | Discharge status: Home / Self Care | Payer: 59 | Source: Ambulatory Visit | Attending: Oncology | Admitting: Oncology

## 2014-01-09 DIAGNOSIS — C189 Malignant neoplasm of colon, unspecified: Secondary | ICD-10-CM | POA: Insufficient documentation

## 2014-01-09 DIAGNOSIS — C787 Secondary malignant neoplasm of liver and intrahepatic bile duct: Secondary | ICD-10-CM

## 2014-01-09 DIAGNOSIS — R188 Other ascites: Secondary | ICD-10-CM | POA: Insufficient documentation

## 2014-01-09 DIAGNOSIS — I959 Hypotension, unspecified: Secondary | ICD-10-CM | POA: Insufficient documentation

## 2014-01-09 NOTE — Procedures (Signed)
Successful US guided paracentesis from right suprapubic area.  Yielded 270 ml of clear yellow fluid.  No immediate complications.  Pt tolerated well.   Specimen was sent for labs.  Hedy Jacob PA-C 01/09/2014 9:22 AM

## 2014-01-11 ENCOUNTER — Other Ambulatory Visit: Payer: Self-pay | Admitting: Oncology

## 2014-01-13 ENCOUNTER — Telehealth: Payer: Self-pay | Admitting: Oncology

## 2014-01-13 ENCOUNTER — Other Ambulatory Visit (HOSPITAL_BASED_OUTPATIENT_CLINIC_OR_DEPARTMENT_OTHER): Payer: 59

## 2014-01-13 ENCOUNTER — Ambulatory Visit (HOSPITAL_BASED_OUTPATIENT_CLINIC_OR_DEPARTMENT_OTHER): Payer: 59

## 2014-01-13 ENCOUNTER — Ambulatory Visit (HOSPITAL_BASED_OUTPATIENT_CLINIC_OR_DEPARTMENT_OTHER): Payer: 59 | Admitting: Nurse Practitioner

## 2014-01-13 ENCOUNTER — Ambulatory Visit: Payer: 59 | Admitting: Nutrition

## 2014-01-13 VITALS — BP 90/64 | HR 105 | Temp 98.0°F | Resp 20 | Ht 70.0 in | Wt 133.1 lb

## 2014-01-13 DIAGNOSIS — C183 Malignant neoplasm of hepatic flexure: Secondary | ICD-10-CM

## 2014-01-13 DIAGNOSIS — C787 Secondary malignant neoplasm of liver and intrahepatic bile duct: Secondary | ICD-10-CM

## 2014-01-13 DIAGNOSIS — C189 Malignant neoplasm of colon, unspecified: Secondary | ICD-10-CM

## 2014-01-13 DIAGNOSIS — D509 Iron deficiency anemia, unspecified: Secondary | ICD-10-CM

## 2014-01-13 DIAGNOSIS — B1920 Unspecified viral hepatitis C without hepatic coma: Secondary | ICD-10-CM

## 2014-01-13 DIAGNOSIS — R634 Abnormal weight loss: Secondary | ICD-10-CM

## 2014-01-13 DIAGNOSIS — Z5112 Encounter for antineoplastic immunotherapy: Secondary | ICD-10-CM

## 2014-01-13 DIAGNOSIS — R109 Unspecified abdominal pain: Secondary | ICD-10-CM

## 2014-01-13 DIAGNOSIS — F17200 Nicotine dependence, unspecified, uncomplicated: Secondary | ICD-10-CM

## 2014-01-13 LAB — COMPREHENSIVE METABOLIC PANEL (CC13)
ALK PHOS: 160 U/L — AB (ref 40–150)
ALT: 12 U/L (ref 0–55)
AST: 26 U/L (ref 5–34)
Albumin: 1.9 g/dL — ABNORMAL LOW (ref 3.5–5.0)
Anion Gap: 10 mEq/L (ref 3–11)
BILIRUBIN TOTAL: 0.36 mg/dL (ref 0.20–1.20)
BUN: 7 mg/dL (ref 7.0–26.0)
CO2: 23 mEq/L (ref 22–29)
Calcium: 8 mg/dL — ABNORMAL LOW (ref 8.4–10.4)
Chloride: 108 mEq/L (ref 98–109)
Creatinine: 0.6 mg/dL — ABNORMAL LOW (ref 0.7–1.3)
Glucose: 94 mg/dl (ref 70–140)
POTASSIUM: 3.5 meq/L (ref 3.5–5.1)
Sodium: 141 mEq/L (ref 136–145)
Total Protein: 6.6 g/dL (ref 6.4–8.3)

## 2014-01-13 LAB — CBC WITH DIFFERENTIAL/PLATELET
BASO%: 0.3 % (ref 0.0–2.0)
BASOS ABS: 0 10*3/uL (ref 0.0–0.1)
EOS%: 3 % (ref 0.0–7.0)
Eosinophils Absolute: 0.2 10*3/uL (ref 0.0–0.5)
HCT: 35.9 % — ABNORMAL LOW (ref 38.4–49.9)
HGB: 11.6 g/dL — ABNORMAL LOW (ref 13.0–17.1)
LYMPH%: 30.1 % (ref 14.0–49.0)
MCH: 28.8 pg (ref 27.2–33.4)
MCHC: 32.2 g/dL (ref 32.0–36.0)
MCV: 89.3 fL (ref 79.3–98.0)
MONO#: 0.7 10*3/uL (ref 0.1–0.9)
MONO%: 11.4 % (ref 0.0–14.0)
NEUT#: 3.6 10*3/uL (ref 1.5–6.5)
NEUT%: 55.2 % (ref 39.0–75.0)
Platelets: 460 10*3/uL — ABNORMAL HIGH (ref 140–400)
RBC: 4.03 10*6/uL — AB (ref 4.20–5.82)
RDW: 17.6 % — AB (ref 11.0–14.6)
WBC: 6.4 10*3/uL (ref 4.0–10.3)
lymph#: 1.9 10*3/uL (ref 0.9–3.3)

## 2014-01-13 LAB — MAGNESIUM (CC13): Magnesium: 1.6 mg/dl (ref 1.5–2.5)

## 2014-01-13 MED ORDER — SODIUM CHLORIDE 0.9 % IV SOLN
Freq: Once | INTRAVENOUS | Status: AC
  Administered 2014-01-13: 11:00:00 via INTRAVENOUS

## 2014-01-13 MED ORDER — OXYCODONE-ACETAMINOPHEN 5-325 MG PO TABS: 1.0000 | ORAL_TABLET | ORAL | Status: DC | PRN

## 2014-01-13 MED ORDER — SODIUM CHLORIDE 0.9 % IJ SOLN
10.0000 mL | INTRAMUSCULAR | Status: DC | PRN
  Administered 2014-01-13: 10 mL
  Filled 2014-01-13: qty 10

## 2014-01-13 MED ORDER — SODIUM CHLORIDE 0.9 % IV SOLN
6.0000 mg/kg | Freq: Once | INTRAVENOUS | Status: AC
  Administered 2014-01-13: 400 mg via INTRAVENOUS
  Filled 2014-01-13: qty 20

## 2014-01-13 MED ORDER — HEPARIN SOD (PORK) LOCK FLUSH 100 UNIT/ML IV SOLN
500.0000 [IU] | Freq: Once | INTRAVENOUS | Status: AC | PRN
  Administered 2014-01-13: 500 [IU]
  Filled 2014-01-13: qty 5

## 2014-01-13 NOTE — Telephone Encounter (Signed)
gv adnprinted appt sched and avs for pt for April adn May.....sed added tx. °

## 2014-01-13 NOTE — Progress Notes (Signed)
Followup completed with patient.  He reports he feels better and he is eating well.  Noted weight decreased drastically to 133.1 pounds April 14 from 148.7 pounds March 24.  Patient is status post paracentesis with 270 mL removed.  Patient reports he has been walking for 10 minutes 2-3 times daily to increase his strength.  Nutrition diagnosis: Unintended weight loss continues.  Intervention: Encouraged patient to increase Ensure Plus to twice a day to 3 times a day.  Recommended patient continue exercise as tolerated.  Encouraged high-calorie, high-protein foods.  Monitoring, evaluation, goals: Patient will work to increase calories and protein to minimize further weight loss.  Next visit: Tuesday, May 12, during chemotherapy.

## 2014-01-13 NOTE — Patient Instructions (Addendum)
[place] Discharge Instructions for Patients Receiving Chemotherapy  Today you received the following chemotherapy agents Vectibix.  To help prevent nausea and vomiting after your treatment, we encourage you to take your nausea medication.   If you develop nausea and vomiting that is not controlled by your nausea medication, call the clinic.   BELOW ARE SYMPTOMS THAT SHOULD BE REPORTED IMMEDIATELY:  *FEVER GREATER THAN 100.5 F  *CHILLS WITH OR WITHOUT FEVER  NAUSEA AND VOMITING THAT IS NOT CONTROLLED WITH YOUR NAUSEA MEDICATION  *UNUSUAL SHORTNESS OF BREATH  *UNUSUAL BRUISING OR BLEEDING  TENDERNESS IN MOUTH AND THROAT WITH OR WITHOUT PRESENCE OF ULCERS  *URINARY PROBLEMS  *BOWEL PROBLEMS   Panitumumab Solution for Injection What is this medicine? PANITUMUMAB (pan i TOOM ue mab) is a chemotherapy drug. It targets a specific protein within cancer cells and stops the cells from growing. It is used to treat colorectal cancer. This medicine may be used for other purposes; ask your health care provider or pharmacist if you have questions. COMMON BRAND NAME(S): Vectibix What should I tell my health care provider before I take this medicine? They need to know if you have any of these conditions: -lung disease, especially lung fibrosis -skin conditions or sensitivity -an unusual or allergic reaction to panitumumab, mouse proteins, other medicines, foods, dyes, or preservatives -pregnant or trying to get pregnant -breast-feeding How should I use this medicine? This drug is given as an infusion into a vein. It is administered in a hospital or clinic by a specially trained health care professional. Talk to your pediatrician regarding the use of this medicine in children. Special care may be needed. Overdosage: If you think you have taken too much of this medicine contact a poison control center or emergency room at once. NOTE: This medicine is only for you. Do not  share this medicine with others. What if I miss a dose? It is important not to miss your dose. Call your doctor or health care professional if you are unable to keep an appointment. What may interact with this medicine? -some medicines for cancer This list may not describe all possible interactions. Give your health care provider a list of all the medicines, herbs, non-prescription drugs, or dietary supplements you use. Also tell them if you smoke, drink alcohol, or use illegal drugs. Some items may interact with your medicine. What should I watch for while using this medicine? Visit your doctor for checks on your progress. This drug may make you feel generally unwell. This is not uncommon, as chemotherapy can affect healthy cells as well as cancer cells. Report any side effects. Continue your course of treatment even though you feel ill unless your doctor tells you to stop. This medicine can make you more sensitive to the sun. Keep out of the sun while receiving this medicine and for 2 months after the last dose. If you cannot avoid being in the sun, wear protective clothing and use sunscreen. Do not use sun lamps or tanning beds/booths. In some cases, you may be given additional medicines to help with side effects. Follow all directions for their use. Call your doctor or health care professional for advice if you get a fever, chills or sore throat, or other symptoms of a cold or flu. Do not treat yourself. This drug decreases your body's ability to fight infections. Try to avoid being around people who are sick. Avoid taking products that contain aspirin, acetaminophen, ibuprofen, naproxen, or ketoprofen unless instructed by your  doctor. These medicines may hide a fever. Do not become pregnant while taking this medicine and for 6 months after the last dose. Women should inform their doctor if they wish to become pregnant or think they might be pregnant. Men should not father a child while taking this  medicine and for 6 months after the last dose. There is a potential for serious side effects to an unborn child. Talk to your health care professional or pharmacist for more information. Do not breast-feed an infant while taking this medicine. What side effects may I notice from receiving this medicine? Side effects that you should report to your doctor or health care professional as soon as possible: -allergic reactions like skin rash, itching or hives, swelling of the face, lips, or tongue -breathing problems -changes in vision -fast, irregular heartbeat -feeling faint or lightheaded, falls -fever, chills -mouth sores -swelling of the ankles, feet, hands -unusually weak or tired Side effects that usually do not require medical attention (report to your doctor or health care professional if they continue or are bothersome): -changes in skin like acne, cracks, skin dryness -constipation -diarrhea -eyelash growth -headache -nail changes -nausea, vomiting -stomach upset This list may not describe all possible side effects. Call your doctor for medical advice about side effects. You may report side effects to FDA at 1-800-FDA-1088. Where should I keep my medicine? This drug is given in a hospital or clinic and will not be stored at home. NOTE: This sheet is a summary. It may not cover all possible information. If you have questions about this medicine, talk to your doctor, pharmacist, or health care provider.  2014, Elsevier/Gold Standard. (2011-02-14 11:00:12)   UNUSUAL RASH Items with * indicate a potential emergency and should be followed up as soon as possible.  Feel free to call the clinic you have any questions or concerns. The clinic phone number is [phone].

## 2014-01-13 NOTE — Progress Notes (Signed)
[name] OFFICE PROGRESS NOTE   Diagnosis:  Metastatic colon cancer.  INTERVAL HISTORY:   Tony Barajas completed cycle 1 irinotecan/Panitumumab on 12/30/2013. He denies nausea/vomiting. No mouth sores. No skin rash. He developed crampy abdominal pain and diarrhea 2-3 days after treatment. Symptoms lasted for a little over one day. He reports recurrent diarrhea over the past several days. He took some Imodium this morning. He notes decreased abdominal pain since undergoing the paracentesis.  He declines further irinotecan due to the abdominal pain and diarrhea.  Objective:  Vital signs in last 24 hours:  Blood pressure 90/64, pulse 105, temperature 98 F (36.7 C), temperature source Oral, resp. rate 20, height 5' 10"  (1.778 m), weight 133 lb 1.6 oz (60.374 kg), SpO2 98.00%.    HEENT: No thrush or ulcerations. Resp: Lungs clear. Cardio: Regular cardiac rhythm. GI: Abdomen soft. Mild tenderness at the right lower quadrant. Vascular: No leg edema. Skin: No skin rash.   Portacath/PICC-without erythema.  Lab Results:  Lab Results  Component Value Date   WBC 6.4 01/13/2014   HGB 11.6* 01/13/2014   HCT 35.9* 01/13/2014   MCV 89.3 01/13/2014   PLT 460* 01/13/2014   NEUTROABS 3.6 01/13/2014    Lab Results  Component Value Date   CEA 2.8 04/14/2013    Imaging:  No results found.  Medications: I have reviewed the patient's current medications.  Assessment/Plan: 1. Hepatic flexure mass with extensive liver metastases. Status post a colonoscopic biopsy on 04/14/2013 confirming at least high-grade glandular dysplasia.  Status post liver biopsy 05/05/2013 confirming metastatic colorectal adenocarcinoma.  No K-ras mutation on extended testing.  Cycle 1 FOLFOX 05/14/2013.  Cycle 2 FOLFOX 05/27/2013.  Cycle 3 FOLFOX 06/10/2013.  Cycle 4 FOLFOX 06/24/2013.  Cycle 5 FOLFOX 07/15/2013.  Restaging CT evaluation 07/25/2013 showed some liver lesions were stable while  others had increased in size. There was a generalized decrease in abdominal lymphadenopathy.  Cycle 6 of FOLFOX 07/29/2013.  Cycle 7 of FOLFOX 08/12/2013.  Cycle 8 FOLFOX 09/02/2013.  Cycle 9 FOLFOX (oxaliplatin held) 09/16/2013  CT 09/29/2013 with stable liver metastases, hepatic flexure mass, and adenopathy. Increased thickening at the ileum with surrounding ascites  Cycle 1 of FOLFIRI 09/29/2013  Cycle 2 FOLFIRI 10/14/2013  Cycle 3 FOLFIRI 10/28/2013  Cycle 4 FOLFIRI 11/11/2013.  Restaging CT evaluation 12/18/2013 showed overall stable hepatic metastatic disease; no new lesions. Progressive abdominal lymphadenopathy; progressive hepatic flexure colon cancer and slightly progressive direct liver invasion; stable SMV thrombosis; interval development of large volume abdominal/pelvic ascites; left pleural effusion and bibasilar atelectasis. Cycle 1 irinotecan/Panitumumab 12/30/2013. 2. Microcytic anemia secondary to #1. He was transfused with PRBCs on 04/30/2013 when the hemoglobin returned at 6.5. He was also given a dose of IV iron. The hemoglobin is stable. 3. Hepatitis C positive. 4. Weight loss.  5. COPD. 6. Tobacco use. 7. History of alcohol use. 8. Nausea following cycle 7 FOLFOX-prolonged period, he reports relief of the nausea with Compazine.  9. Abdominal pain-progressive, potentially related to the liver metastases, colon mass, or inflammation at the terminal ileum. The terminal ileum inflammation could be related to vascular compromise. The abdominal pain improved following a paracentesis on 01/09/2014. 10. Status post paracentesis 01/09/2014. Cytology showed reactive mesothelial cells; acute and chronic inflammation. 11. Diarrhea and crampy abdominal pain following cycle 1 irinotecan/Panitumumab. He declines further irinotecan.   Disposition: Tony Barajas has completed 1 cycle of irinotecan/Panitumumab. He declines further irinotecan due to diarrhea and crampy abdominal pain. He  would like to  continue with single agent Panitumumab. He expressed an interest in clinical trials. We offered to make a referral to [facility]. He would like to discuss this with his family prior to a referral being made.  He will return for a followup visit in Panitumumab in 2 weeks. He will contact the office in the interim with any problems.  Plan reviewed with Dr. Benay Spice.    Owens Shark ANP/GNP-BC   01/13/2014  11:23 AM

## 2014-01-25 ENCOUNTER — Other Ambulatory Visit: Payer: Self-pay | Admitting: Oncology

## 2014-01-27 ENCOUNTER — Telehealth: Payer: Self-pay | Admitting: *Deleted

## 2014-01-27 ENCOUNTER — Ambulatory Visit (HOSPITAL_BASED_OUTPATIENT_CLINIC_OR_DEPARTMENT_OTHER): Payer: 59

## 2014-01-27 ENCOUNTER — Other Ambulatory Visit (HOSPITAL_BASED_OUTPATIENT_CLINIC_OR_DEPARTMENT_OTHER): Payer: 59

## 2014-01-27 ENCOUNTER — Telehealth: Payer: Self-pay | Admitting: Oncology

## 2014-01-27 ENCOUNTER — Ambulatory Visit (HOSPITAL_BASED_OUTPATIENT_CLINIC_OR_DEPARTMENT_OTHER): Payer: 59 | Admitting: Oncology

## 2014-01-27 VITALS — BP 101/65 | HR 122 | Temp 97.5°F | Resp 19 | Ht 70.0 in | Wt 132.7 lb

## 2014-01-27 DIAGNOSIS — D6481 Anemia due to antineoplastic chemotherapy: Secondary | ICD-10-CM

## 2014-01-27 DIAGNOSIS — C183 Malignant neoplasm of hepatic flexure: Secondary | ICD-10-CM

## 2014-01-27 DIAGNOSIS — C787 Secondary malignant neoplasm of liver and intrahepatic bile duct: Secondary | ICD-10-CM

## 2014-01-27 DIAGNOSIS — Z5112 Encounter for antineoplastic immunotherapy: Secondary | ICD-10-CM

## 2014-01-27 DIAGNOSIS — R109 Unspecified abdominal pain: Secondary | ICD-10-CM

## 2014-01-27 DIAGNOSIS — B1920 Unspecified viral hepatitis C without hepatic coma: Secondary | ICD-10-CM

## 2014-01-27 DIAGNOSIS — C189 Malignant neoplasm of colon, unspecified: Secondary | ICD-10-CM

## 2014-01-27 DIAGNOSIS — T451X5A Adverse effect of antineoplastic and immunosuppressive drugs, initial encounter: Secondary | ICD-10-CM

## 2014-01-27 LAB — COMPREHENSIVE METABOLIC PANEL (CC13)
ALT: 21 U/L (ref 0–55)
ANION GAP: 11 meq/L (ref 3–11)
AST: 35 U/L — ABNORMAL HIGH (ref 5–34)
Albumin: 2.2 g/dL — ABNORMAL LOW (ref 3.5–5.0)
Alkaline Phosphatase: 179 U/L — ABNORMAL HIGH (ref 40–150)
BUN: 6.3 mg/dL — AB (ref 7.0–26.0)
CALCIUM: 8.6 mg/dL (ref 8.4–10.4)
CO2: 22 meq/L (ref 22–29)
Chloride: 110 mEq/L — ABNORMAL HIGH (ref 98–109)
Creatinine: 0.6 mg/dL — ABNORMAL LOW (ref 0.7–1.3)
GLUCOSE: 138 mg/dL (ref 70–140)
POTASSIUM: 3.7 meq/L (ref 3.5–5.1)
Sodium: 143 mEq/L (ref 136–145)
Total Bilirubin: 0.42 mg/dL (ref 0.20–1.20)
Total Protein: 7.1 g/dL (ref 6.4–8.3)

## 2014-01-27 LAB — CBC WITH DIFFERENTIAL/PLATELET
BASO%: 0.5 % (ref 0.0–2.0)
Basophils Absolute: 0 10*3/uL (ref 0.0–0.1)
EOS%: 2.6 % (ref 0.0–7.0)
Eosinophils Absolute: 0.2 10*3/uL (ref 0.0–0.5)
HCT: 42.5 % (ref 38.4–49.9)
HGB: 13.5 g/dL (ref 13.0–17.1)
LYMPH#: 3.1 10*3/uL (ref 0.9–3.3)
LYMPH%: 32.6 % (ref 14.0–49.0)
MCH: 28.7 pg (ref 27.2–33.4)
MCHC: 31.8 g/dL — ABNORMAL LOW (ref 32.0–36.0)
MCV: 90.3 fL (ref 79.3–98.0)
MONO#: 0.8 10*3/uL (ref 0.1–0.9)
MONO%: 8.5 % (ref 0.0–14.0)
NEUT#: 5.4 10*3/uL (ref 1.5–6.5)
NEUT%: 55.8 % (ref 39.0–75.0)
Platelets: 472 10*3/uL — ABNORMAL HIGH (ref 140–400)
RBC: 4.7 10*6/uL (ref 4.20–5.82)
RDW: 19.7 % — ABNORMAL HIGH (ref 11.0–14.6)
WBC: 9.6 10*3/uL (ref 4.0–10.3)

## 2014-01-27 LAB — MAGNESIUM (CC13): MAGNESIUM: 1.6 mg/dL (ref 1.5–2.5)

## 2014-01-27 MED ORDER — OXYCODONE-ACETAMINOPHEN 10-325 MG PO TABS: 1.0000 | ORAL_TABLET | ORAL | Status: DC | PRN

## 2014-01-27 MED ORDER — SODIUM CHLORIDE 0.9 % IV SOLN
6.0000 mg/kg | Freq: Once | INTRAVENOUS | Status: AC
  Administered 2014-01-27: 400 mg via INTRAVENOUS
  Filled 2014-01-27: qty 20

## 2014-01-27 MED ORDER — SODIUM CHLORIDE 0.9 % IJ SOLN
10.0000 mL | INTRAMUSCULAR | Status: DC | PRN
  Administered 2014-01-27: 10 mL
  Filled 2014-01-27: qty 10

## 2014-01-27 MED ORDER — SODIUM CHLORIDE 0.9 % IV SOLN
Freq: Once | INTRAVENOUS | Status: AC
  Administered 2014-01-27: 11:00:00 via INTRAVENOUS

## 2014-01-27 MED ORDER — HEPARIN SOD (PORK) LOCK FLUSH 100 UNIT/ML IV SOLN
500.0000 [IU] | Freq: Once | INTRAVENOUS | Status: AC | PRN
  Administered 2014-01-27: 500 [IU]
  Filled 2014-01-27: qty 5

## 2014-01-27 NOTE — Progress Notes (Signed)
[place] OFFICE PROGRESS NOTE   Diagnosis: Colon cancer  INTERVAL HISTORY:   Tony Barajas returns as scheduled. He continues to have intermittent right abdominal pain. The pain is partially relieved with oxycodone. The pain is not consistent. He completed a treatment with panitumumab on 01/13/2014. He reports 1 day of diarrhea after eating ice cream. No other diarrhea. No rash. He feels stronger since discontinuing the irinotecan.  Objective:  Vital signs in last 24 hours:  Blood pressure 101/65, pulse 122, temperature 97.5 F (36.4 C), temperature source Oral, resp. rate 19, height 5' 10"  (1.778 m), weight 132 lb 11.2 oz (60.192 kg), SpO2 99.00%.    HEENT: No thrush or ulcers Resp: Lungs clear bilaterally Cardio: Regular rate and rhythm, tachycardia GI: No hepatomegaly, tender in the right lower quadrant, no mass, the abdomen is distended with ascites Vascular: No leg edema  Skin: No rash   Portacath/PICC-without erythema  Lab Results:  Lab Results  Component Value Date   WBC 9.6 01/27/2014   HGB 13.5 01/27/2014   HCT 42.5 01/27/2014   MCV 90.3 01/27/2014   PLT 472* 01/27/2014   NEUTROABS 5.4 01/27/2014    Medications: I have reviewed the patient's current medications.  Assessment/Plan: 1. Hepatic flexure mass with extensive liver metastases. Status post a colonoscopic biopsy on 04/14/2013 confirming at least high-grade glandular dysplasia.  Status post liver biopsy 05/05/2013 confirming metastatic colorectal adenocarcinoma.  No K-ras mutation on extended testing.  Cycle 1 FOLFOX 05/14/2013.  Cycle 2 FOLFOX 05/27/2013.  Cycle 3 FOLFOX 06/10/2013.  Cycle 4 FOLFOX 06/24/2013.  Cycle 5 FOLFOX 07/15/2013.  Restaging CT evaluation 07/25/2013 showed some liver lesions were stable while others had increased in size. There was a generalized decrease in abdominal lymphadenopathy.  Cycle 6 of FOLFOX 07/29/2013.  Cycle 7 of FOLFOX 08/12/2013.  Cycle 8 FOLFOX  09/02/2013.  Cycle 9 FOLFOX (oxaliplatin held) 09/16/2013  CT 09/29/2013 with stable liver metastases, hepatic flexure mass, and adenopathy. Increased thickening at the ileum with surrounding ascites  Cycle 1 of FOLFIRI 09/29/2013  Cycle 2 FOLFIRI 10/14/2013  Cycle 3 FOLFIRI 10/28/2013  Cycle 4 FOLFIRI 11/11/2013.  Restaging CT evaluation 12/18/2013 showed overall stable hepatic metastatic disease; no new lesions. Progressive abdominal lymphadenopathy; progressive hepatic flexure colon cancer and slightly progressive direct liver invasion; stable SMV thrombosis; interval development of large volume abdominal/pelvic ascites; left pleural effusion and bibasilar atelectasis.  Cycle 1 irinotecan/Panitumumab 12/30/2013. Continuation of single agent panitumumab beginning 01/13/2014 2. Microcytic anemia secondary to #1. He was transfused with PRBCs on 04/30/2013 when the hemoglobin returned at 6.5. He was also given a dose of IV iron. The hemoglobin is stable. 3. Hepatitis C positive. 4. Weight loss.  5. COPD. 6. Tobacco use. 7. History of alcohol use. 8. Nausea following cycle 7 FOLFOX-prolonged period, he reports relief of the nausea with Compazine.  9. Abdominal pain-progressive, potentially related to the liver metastases, colon mass, or inflammation at the terminal ileum. The terminal ileum inflammation could be related to vascular compromise. The abdominal pain improved following a paracentesis on 01/09/2014. 10. Status post paracentesis 01/09/2014. Cytology showed reactive mesothelial cells; acute and chronic inflammation. 11. Diarrhea and crampy abdominal pain following cycle 1 irinotecan/Panitumumab. He declines further irinotecan. 12. Chronic tachycardia  Disposition:  He appears stable today. The plan is to continue treatment with single agent panitumumab. Tony Barajas requested an increase in the oxycodone dose. He will use Percocet 10/325 as needed. He will return for an office visit  and panitumumab in 2  weeks. We'll arrange for a repeat therapeutic paracentesis as needed.  Ladell Pier, MD  01/27/2014  1:28 PM

## 2014-01-27 NOTE — Telephone Encounter (Signed)
gv adn prnted appts cehd adn avs for pt for April adn May....emailed MW to add 5.26 tx

## 2014-01-27 NOTE — Telephone Encounter (Signed)
Per staff message and POF I have scheduled appts. I have advised scheduler that 130pm is the first available [facility]

## 2014-02-08 ENCOUNTER — Other Ambulatory Visit: Payer: Self-pay | Admitting: Oncology

## 2014-02-10 ENCOUNTER — Ambulatory Visit: Payer: 59 | Admitting: Nutrition

## 2014-02-10 ENCOUNTER — Other Ambulatory Visit (HOSPITAL_BASED_OUTPATIENT_CLINIC_OR_DEPARTMENT_OTHER): Payer: 59

## 2014-02-10 ENCOUNTER — Ambulatory Visit (HOSPITAL_BASED_OUTPATIENT_CLINIC_OR_DEPARTMENT_OTHER): Payer: 59

## 2014-02-10 ENCOUNTER — Other Ambulatory Visit: Payer: Self-pay | Admitting: *Deleted

## 2014-02-10 VITALS — BP 91/62 | HR 106 | Temp 97.9°F

## 2014-02-10 DIAGNOSIS — C787 Secondary malignant neoplasm of liver and intrahepatic bile duct: Secondary | ICD-10-CM

## 2014-02-10 DIAGNOSIS — C183 Malignant neoplasm of hepatic flexure: Secondary | ICD-10-CM

## 2014-02-10 DIAGNOSIS — C189 Malignant neoplasm of colon, unspecified: Secondary | ICD-10-CM

## 2014-02-10 DIAGNOSIS — Z5112 Encounter for antineoplastic immunotherapy: Secondary | ICD-10-CM

## 2014-02-10 LAB — COMPREHENSIVE METABOLIC PANEL (CC13)
ALBUMIN: 2 g/dL — AB (ref 3.5–5.0)
ALK PHOS: 149 U/L (ref 40–150)
ALT: 17 U/L (ref 0–55)
AST: 30 U/L (ref 5–34)
Anion Gap: 10 mEq/L (ref 3–11)
BILIRUBIN TOTAL: 0.44 mg/dL (ref 0.20–1.20)
BUN: 7.2 mg/dL (ref 7.0–26.0)
CO2: 22 mEq/L (ref 22–29)
Calcium: 8.1 mg/dL — ABNORMAL LOW (ref 8.4–10.4)
Chloride: 109 mEq/L (ref 98–109)
Creatinine: 0.5 mg/dL — ABNORMAL LOW (ref 0.7–1.3)
GLUCOSE: 76 mg/dL (ref 70–140)
POTASSIUM: 3.2 meq/L — AB (ref 3.5–5.1)
Sodium: 141 mEq/L (ref 136–145)
Total Protein: 6.4 g/dL (ref 6.4–8.3)

## 2014-02-10 LAB — CBC WITH DIFFERENTIAL/PLATELET
BASO%: 0.3 % (ref 0.0–2.0)
BASOS ABS: 0 10*3/uL (ref 0.0–0.1)
EOS ABS: 0.2 10*3/uL (ref 0.0–0.5)
EOS%: 3.1 % (ref 0.0–7.0)
HEMATOCRIT: 38.4 % (ref 38.4–49.9)
HGB: 12.3 g/dL — ABNORMAL LOW (ref 13.0–17.1)
LYMPH%: 32.8 % (ref 14.0–49.0)
MCH: 28.7 pg (ref 27.2–33.4)
MCHC: 32 g/dL (ref 32.0–36.0)
MCV: 89.7 fL (ref 79.3–98.0)
MONO#: 0.9 10*3/uL (ref 0.1–0.9)
MONO%: 11.5 % (ref 0.0–14.0)
NEUT%: 52.3 % (ref 39.0–75.0)
NEUTROS ABS: 3.9 10*3/uL (ref 1.5–6.5)
Platelets: 382 10*3/uL (ref 140–400)
RBC: 4.28 10*6/uL (ref 4.20–5.82)
RDW: 18.4 % — ABNORMAL HIGH (ref 11.0–14.6)
WBC: 7.4 10*3/uL (ref 4.0–10.3)
lymph#: 2.4 10*3/uL (ref 0.9–3.3)

## 2014-02-10 LAB — MAGNESIUM (CC13): Magnesium: 1.1 mg/dl — CL (ref 1.5–2.5)

## 2014-02-10 MED ORDER — POTASSIUM CHLORIDE CRYS ER 20 MEQ PO TBCR: 20.0000 meq | EXTENDED_RELEASE_TABLET | Freq: Every day | ORAL | Status: AC

## 2014-02-10 MED ORDER — SODIUM CHLORIDE 0.9 % IJ SOLN
10.0000 mL | INTRAMUSCULAR | Status: DC | PRN
  Administered 2014-02-10: 10 mL
  Filled 2014-02-10: qty 10

## 2014-02-10 MED ORDER — HEPARIN SOD (PORK) LOCK FLUSH 100 UNIT/ML IV SOLN
500.0000 [IU] | Freq: Once | INTRAVENOUS | Status: AC | PRN
  Administered 2014-02-10: 500 [IU]
  Filled 2014-02-10: qty 5

## 2014-02-10 MED ORDER — MAGNESIUM OXIDE 400 (241.3 MG) MG PO TABS: 400.0000 mg | ORAL_TABLET | Freq: Two times a day (BID) | ORAL | Status: DC

## 2014-02-10 MED ORDER — SODIUM CHLORIDE 0.9 % IV SOLN
Freq: Once | INTRAVENOUS | Status: AC
  Administered 2014-02-10: 09:00:00 via INTRAVENOUS

## 2014-02-10 MED ORDER — SODIUM CHLORIDE 0.9 % IV SOLN
6.0000 mg/kg | Freq: Once | INTRAVENOUS | Status: AC
  Administered 2014-02-10: 400 mg via INTRAVENOUS
  Filled 2014-02-10: qty 20

## 2014-02-10 NOTE — Telephone Encounter (Signed)
Spoke with pt in infusion area regarding new script sent for Magnesium (take twice a day) and KCL (take one daily) per MD.  Pt verbalized understanding of instructions.

## 2014-02-10 NOTE — Progress Notes (Signed)
Patient finished with chemotherapy and left before nutrition appointment.  I have seen patient multiple times.  Weight documented as 132 pounds, stable for approximately 1 month.  RN reports patient weighed today and weight was still 132 pounds.  Patient is eating fine.  He doesn't tolerate vegetables well so eats primarily meats and potatoes.    Nutrition Diagnosis:  Unintended weight loss improved.  Intervention:  Patient to eat as tolerated.  Monitoring, evaluation, goals:  Patient will tolerate oral diet for weight maintenance.  Next Visit: Tuesday, May 26 in chemotherapy room.

## 2014-02-10 NOTE — Patient Instructions (Signed)
[facility] Discharge Instructions for Patients Receiving Chemotherapy  Today you received the following chemotherapy agents: Vectibix.  To help prevent nausea and vomiting after your treatment, we encourage you to take your nausea medication as prescribed.   If you develop nausea and vomiting that is not controlled by your nausea medication, call the clinic.   BELOW ARE SYMPTOMS THAT SHOULD BE REPORTED IMMEDIATELY:  *FEVER GREATER THAN 100.5 F  *CHILLS WITH OR WITHOUT FEVER  NAUSEA AND VOMITING THAT IS NOT CONTROLLED WITH YOUR NAUSEA MEDICATION  *UNUSUAL SHORTNESS OF BREATH  *UNUSUAL BRUISING OR BLEEDING  TENDERNESS IN MOUTH AND THROAT WITH OR WITHOUT PRESENCE OF ULCERS  *URINARY PROBLEMS  *BOWEL PROBLEMS  UNUSUAL RASH Items with * indicate a potential emergency and should be followed up as soon as possible.  Feel free to call the clinic you have any questions or concerns. The clinic phone number is [phone].    

## 2014-02-23 ENCOUNTER — Other Ambulatory Visit: Payer: Self-pay | Admitting: Oncology

## 2014-02-24 ENCOUNTER — Encounter: Payer: 59 | Admitting: Nutrition

## 2014-02-24 ENCOUNTER — Other Ambulatory Visit (HOSPITAL_BASED_OUTPATIENT_CLINIC_OR_DEPARTMENT_OTHER): Payer: 59

## 2014-02-24 ENCOUNTER — Ambulatory Visit: Payer: 59

## 2014-02-24 ENCOUNTER — Ambulatory Visit (HOSPITAL_BASED_OUTPATIENT_CLINIC_OR_DEPARTMENT_OTHER): Payer: 59 | Admitting: Nurse Practitioner

## 2014-02-24 ENCOUNTER — Telehealth: Payer: Self-pay | Admitting: Oncology

## 2014-02-24 ENCOUNTER — Ambulatory Visit: Payer: 59 | Admitting: Nurse Practitioner

## 2014-02-24 ENCOUNTER — Other Ambulatory Visit: Payer: 59

## 2014-02-24 VITALS — BP 90/73 | HR 104 | Temp 97.3°F | Resp 18 | Ht 70.0 in | Wt 128.0 lb

## 2014-02-24 DIAGNOSIS — C787 Secondary malignant neoplasm of liver and intrahepatic bile duct: Secondary | ICD-10-CM

## 2014-02-24 DIAGNOSIS — C183 Malignant neoplasm of hepatic flexure: Secondary | ICD-10-CM

## 2014-02-24 DIAGNOSIS — C189 Malignant neoplasm of colon, unspecified: Secondary | ICD-10-CM

## 2014-02-24 LAB — CBC WITH DIFFERENTIAL/PLATELET
BASO%: 0.3 % (ref 0.0–2.0)
BASOS ABS: 0 10*3/uL (ref 0.0–0.1)
EOS%: 1 % (ref 0.0–7.0)
Eosinophils Absolute: 0.1 10*3/uL (ref 0.0–0.5)
HCT: 40.6 % (ref 38.4–49.9)
HGB: 13.1 g/dL (ref 13.0–17.1)
LYMPH%: 21.6 % (ref 14.0–49.0)
MCH: 28.7 pg (ref 27.2–33.4)
MCHC: 32.4 g/dL (ref 32.0–36.0)
MCV: 88.6 fL (ref 79.3–98.0)
MONO#: 1.1 10*3/uL — ABNORMAL HIGH (ref 0.1–0.9)
MONO%: 10 % (ref 0.0–14.0)
NEUT%: 67.1 % (ref 39.0–75.0)
NEUTROS ABS: 7.4 10*3/uL — AB (ref 1.5–6.5)
PLATELETS: 451 10*3/uL — AB (ref 140–400)
RBC: 4.57 10*6/uL (ref 4.20–5.82)
RDW: 19 % — ABNORMAL HIGH (ref 11.0–14.6)
WBC: 11 10*3/uL — ABNORMAL HIGH (ref 4.0–10.3)
lymph#: 2.4 10*3/uL (ref 0.9–3.3)

## 2014-02-24 LAB — COMPREHENSIVE METABOLIC PANEL (CC13)
ALK PHOS: 152 U/L — AB (ref 40–150)
ALT: 17 U/L (ref 0–55)
AST: 39 U/L — ABNORMAL HIGH (ref 5–34)
Albumin: 1.9 g/dL — ABNORMAL LOW (ref 3.5–5.0)
Anion Gap: 9 mEq/L (ref 3–11)
BUN: 5.3 mg/dL — AB (ref 7.0–26.0)
CO2: 25 mEq/L (ref 22–29)
Calcium: 7.2 mg/dL — ABNORMAL LOW (ref 8.4–10.4)
Chloride: 109 mEq/L (ref 98–109)
Creatinine: 0.6 mg/dL — ABNORMAL LOW (ref 0.7–1.3)
GLUCOSE: 98 mg/dL (ref 70–140)
Potassium: 3.7 mEq/L (ref 3.5–5.1)
Sodium: 143 mEq/L (ref 136–145)
Total Bilirubin: 0.48 mg/dL (ref 0.20–1.20)
Total Protein: 6.2 g/dL — ABNORMAL LOW (ref 6.4–8.3)

## 2014-02-24 LAB — MAGNESIUM (CC13): MAGNESIUM: 1.2 mg/dL — AB (ref 1.5–2.5)

## 2014-02-24 MED ORDER — OXYCODONE-ACETAMINOPHEN 10-325 MG PO TABS: 1.0000 | ORAL_TABLET | ORAL | Status: DC | PRN

## 2014-02-24 NOTE — Progress Notes (Deleted)
Left message with Nutritionist to see if she could meet with patient regarding weight loss and nutritional supplements.

## 2014-02-24 NOTE — Progress Notes (Addendum)
[place] OFFICE PROGRESS NOTE   Diagnosis:  Colon cancer.  INTERVAL HISTORY:   Tony Barajas returns as scheduled. He was last treated with Panitumumab on 02/10/2014. He reports diarrhea and intermittent crampy abdominal pain over the past 2 weeks. He had nausea/vomiting for the first few days after treatment. No mouth sores or skin rash. He feels hungry but states he is "scared" to eat due to the diarrhea and abdominal cramps. He has dyspnea on exertion. He continues to have pain at the right lower abdomen. He takes Percocet as needed.  He declines further Panitumumab and requests a referral to [facility] for a second opinion.  Objective:  Vital signs in last 24 hours:  Blood pressure 90/73, pulse 104, temperature 97.3 F (36.3 C), temperature source Oral, resp. rate 18, height 5' 10"  (1.778 m), weight 128 lb 0.5 oz (58.075 kg).    HEENT: No thrush or ulcerations. Resp: Lungs clear. Cardio: Regular cardiac rhythm. GI: Abdomen is distended with ascites. No hepatomegaly. Tender right lower quadrant. Vascular: No leg edema. Skin: No rash.  Port-A-Cath site is without erythema.    Lab Results:  Lab Results  Component Value Date   WBC 11.0* 02/24/2014   HGB 13.1 02/24/2014   HCT 40.6 02/24/2014   MCV 88.6 02/24/2014   PLT 451* 02/24/2014   NEUTROABS 7.4* 02/24/2014   Magnesium 1.2.  Imaging:  No results found.  Medications: I have reviewed the patient's current medications.  Assessment/Plan: 1. Hepatic flexure mass with extensive liver metastases. Status post a colonoscopic biopsy on 04/14/2013 confirming at least high-grade glandular dysplasia.  Status post liver biopsy 05/05/2013 confirming metastatic colorectal adenocarcinoma.  No K-ras mutation on extended testing.  Cycle 1 FOLFOX 05/14/2013.  Cycle 2 FOLFOX 05/27/2013.  Cycle 3 FOLFOX 06/10/2013.  Cycle 4 FOLFOX 06/24/2013.  Cycle 5 FOLFOX 07/15/2013.  Restaging CT evaluation 07/25/2013 showed some  liver lesions were stable while others had increased in size. There was a generalized decrease in abdominal lymphadenopathy.  Cycle 6 of FOLFOX 07/29/2013.  Cycle 7 of FOLFOX 08/12/2013.  Cycle 8 FOLFOX 09/02/2013.  Cycle 9 FOLFOX (oxaliplatin held) 09/16/2013  CT 09/29/2013 with stable liver metastases, hepatic flexure mass, and adenopathy. Increased thickening at the ileum with surrounding ascites  Cycle 1 of FOLFIRI 09/29/2013  Cycle 2 FOLFIRI 10/14/2013  Cycle 3 FOLFIRI 10/28/2013  Cycle 4 FOLFIRI 11/11/2013.  Restaging CT evaluation 12/18/2013 showed overall stable hepatic metastatic disease; no new lesions. Progressive abdominal lymphadenopathy; progressive hepatic flexure colon cancer and slightly progressive direct liver invasion; stable SMV thrombosis; interval development of large volume abdominal/pelvic ascites; left pleural effusion and bibasilar atelectasis.  Cycle 1 irinotecan/Panitumumab 12/30/2013.  Continuation of single agent panitumumab beginning 01/13/2014 2. Microcytic anemia secondary to #1. He was transfused with PRBCs on 04/30/2013 when the hemoglobin returned at 6.5. He was also given a dose of IV iron. The hemoglobin is stable. 3. Hepatitis C positive. 4. Weight loss.  5. COPD. 6. Tobacco use. 7. History of alcohol use. 8. Nausea following cycle 7 FOLFOX-prolonged period, he reports relief of the nausea with Compazine.  9. Abdominal pain-progressive, potentially related to the liver metastases, colon mass, or inflammation at the terminal ileum. The terminal ileum inflammation could be related to vascular compromise. The abdominal pain improved following a paracentesis on 01/09/2014. 10. Status post paracentesis 01/09/2014. Cytology showed reactive mesothelial cells; acute and chronic inflammation. 11. Diarrhea and crampy abdominal pain following cycle 1 irinotecan/Panitumumab. He declined further irinotecan. Panitumumab continued. He continues to have  diarrhea and  crampy abdominal pain. 12. Chronic tachycardia.   Disposition: Tony Barajas cycle status is unchanged. He declines further Panitumumab. He would like a second opinion at [facility] for treatment options. Dr. Benay Spice recommends a referral to Dr. Altamease Oiler. We made a referral today. He will return for a followup visit here in 2-3 weeks. He was given a new prescription for Percocet at today's visit.  He will contact the office prior to his next visit with any problems.  Patient seen with Dr. Benay Spice.    Tony Barajas ANP/GNP-BC   02/24/2014  2:08 PM  This was a shared visit with Tony Barajas. Tony Barajas has metastatic colon cancer. He declines further irinotecan or panitumumab secondary to diarrhea. We will refer him to [facility] for a second opinion. He has not received Avastin, but I think there is only a small chance he would respond to Avastin based therapy. We were reluctant to administer Avastin in his case secondary to the potential for bowel perforation with the colon tumor invading the liver.  Julieanne Manson, M.D.

## 2014-02-24 NOTE — Progress Notes (Signed)
Disregard previous note

## 2014-02-24 NOTE — Telephone Encounter (Signed)
Sent referral to [facility]..the patient aware he will be contacted

## 2014-02-25 ENCOUNTER — Telehealth: Payer: Self-pay | Admitting: *Deleted

## 2014-02-25 ENCOUNTER — Telehealth: Payer: Self-pay | Admitting: Oncology

## 2014-02-25 NOTE — Telephone Encounter (Signed)
Late entry for 02/14/14- pt had not been taking magnesium reliably. He was instructed during office visit to take 400 mg BID.

## 2014-02-25 NOTE — Telephone Encounter (Signed)
Faxed pt medical records to Dr. Randell Loop office.  The nurse will review records and call with appt,

## 2014-02-26 ENCOUNTER — Telehealth: Payer: Self-pay | Admitting: Oncology

## 2014-02-26 NOTE — Telephone Encounter (Signed)
Pt appt. To see Dr. Altamease Oiler @ [facility] is 03/05/14@2 :63. Medical records faxed.  Slides and scans will be fedex'ed. Pt is aware

## 2014-03-04 ENCOUNTER — Telehealth: Payer: Self-pay | Admitting: *Deleted

## 2014-03-04 NOTE — Telephone Encounter (Signed)
Called patient to f/u on referral to Dr. Altamease Oiler. He sees him tomorrow at 2:30pm.

## 2014-03-10 ENCOUNTER — Telehealth: Payer: Self-pay | Admitting: Oncology

## 2014-03-10 ENCOUNTER — Ambulatory Visit (HOSPITAL_BASED_OUTPATIENT_CLINIC_OR_DEPARTMENT_OTHER): Payer: 59 | Admitting: Nurse Practitioner

## 2014-03-10 VITALS — BP 87/70 | HR 97 | Temp 97.0°F | Resp 18 | Ht 70.0 in | Wt 128.6 lb

## 2014-03-10 DIAGNOSIS — Z452 Encounter for adjustment and management of vascular access device: Secondary | ICD-10-CM

## 2014-03-10 DIAGNOSIS — C787 Secondary malignant neoplasm of liver and intrahepatic bile duct: Secondary | ICD-10-CM

## 2014-03-10 DIAGNOSIS — R109 Unspecified abdominal pain: Secondary | ICD-10-CM

## 2014-03-10 DIAGNOSIS — C183 Malignant neoplasm of hepatic flexure: Secondary | ICD-10-CM

## 2014-03-10 DIAGNOSIS — C189 Malignant neoplasm of colon, unspecified: Secondary | ICD-10-CM

## 2014-03-10 DIAGNOSIS — R197 Diarrhea, unspecified: Secondary | ICD-10-CM

## 2014-03-10 DIAGNOSIS — B1920 Unspecified viral hepatitis C without hepatic coma: Secondary | ICD-10-CM

## 2014-03-10 DIAGNOSIS — D509 Iron deficiency anemia, unspecified: Secondary | ICD-10-CM

## 2014-03-10 MED ORDER — SODIUM CHLORIDE 0.9 % IJ SOLN
10.0000 mL | INTRAMUSCULAR | Status: DC | PRN
  Administered 2014-03-10: 10 mL via INTRAVENOUS
  Filled 2014-03-10: qty 10

## 2014-03-10 MED ORDER — HEPARIN SOD (PORK) LOCK FLUSH 100 UNIT/ML IV SOLN
500.0000 [IU] | Freq: Once | INTRAVENOUS | Status: AC
  Administered 2014-03-10: 500 [IU] via INTRAVENOUS
  Filled 2014-03-10: qty 5

## 2014-03-10 MED ORDER — PANTOPRAZOLE SODIUM 40 MG PO TBEC: 40.0000 mg | DELAYED_RELEASE_TABLET | Freq: Every day | ORAL | Status: AC

## 2014-03-10 NOTE — Progress Notes (Addendum)
[place] OFFICE PROGRESS NOTE   Diagnosis:  Colon cancer.  INTERVAL HISTORY:   Tony Barajas returns as scheduled. His main complaint is "gas pain" after eating. He has tried an over-the-counter gas medication with no improvement. He takes Percocet as needed. In the evenings he notes reflux-type symptoms. He has a liquid bowel movements 1 time a day. No significant nausea/vomiting. Oral intake is poor. He estimates eating 1 meal a day.  Objective:  Vital signs in last 24 hours:  Blood pressure 87/70, pulse 97, temperature 97 F (36.1 C), temperature source Oral, resp. rate 18, height 5' 10"  (1.778 m), weight 128 lb 9.6 oz (58.333 kg).    HEENT: No thrush or ulcerations. Resp: Lungs clear. Cardio: Regular cardiac rhythm. GI: Abdomen distended. Bowel sounds active. Tender at the right lower quadrant. Vascular: No leg edema.  Port-A-Cath site without erythema.   Lab Results:  Lab Results  Component Value Date   WBC 11.0* 02/24/2014   HGB 13.1 02/24/2014   HCT 40.6 02/24/2014   MCV 88.6 02/24/2014   PLT 451* 02/24/2014   NEUTROABS 7.4* 02/24/2014    Imaging:  No results found.  Medications: I have reviewed the patient's current medications.  Assessment/Plan: 1. Hepatic flexure mass with extensive liver metastases. Status post a colonoscopic biopsy on 04/14/2013 confirming at least high-grade glandular dysplasia.  Status post liver biopsy 05/05/2013 confirming metastatic colorectal adenocarcinoma.  No K-ras mutation on extended testing.  Cycle 1 FOLFOX 05/14/2013.  Cycle 2 FOLFOX 05/27/2013.  Cycle 3 FOLFOX 06/10/2013.  Cycle 4 FOLFOX 06/24/2013.  Cycle 5 FOLFOX 07/15/2013.  Restaging CT evaluation 07/25/2013 showed some liver lesions were stable while others had increased in size. There was a generalized decrease in abdominal lymphadenopathy.  Cycle 6 of FOLFOX 07/29/2013.  Cycle 7 of FOLFOX 08/12/2013.  Cycle 8 FOLFOX 09/02/2013.  Cycle 9 FOLFOX  (oxaliplatin held) 09/16/2013  CT 09/29/2013 with stable liver metastases, hepatic flexure mass, and adenopathy. Increased thickening at the ileum with surrounding ascites  Cycle 1 of FOLFIRI 09/29/2013  Cycle 2 FOLFIRI 10/14/2013  Cycle 3 FOLFIRI 10/28/2013  Cycle 4 FOLFIRI 11/11/2013.  Restaging CT evaluation 12/18/2013 showed overall stable hepatic metastatic disease; no new lesions. Progressive abdominal lymphadenopathy; progressive hepatic flexure colon cancer and slightly progressive direct liver invasion; stable SMV thrombosis; interval development of large volume abdominal/pelvic ascites; left pleural effusion and bibasilar atelectasis.  Cycle 1 irinotecan/Panitumumab 12/30/2013.  Continuation of single agent panitumumab beginning 01/13/2014. 2. Microcytic anemia secondary to #1. He was transfused with PRBCs on 04/30/2013 when the hemoglobin returned at 6.5. He was also given a dose of IV iron. The hemoglobin is stable. 3. Hepatitis C positive. 4. Weight loss.  5. COPD. 6. Tobacco use. 7. History of alcohol use. 8. Nausea following cycle 7 FOLFOX-prolonged period, he reports relief of the nausea with Compazine.  9. Abdominal pain-progressive, potentially related to the liver metastases, colon mass, or inflammation at the terminal ileum. The terminal ileum inflammation could be related to vascular compromise. The abdominal pain improved following a paracentesis on 01/09/2014. 10. Status post paracentesis 01/09/2014. Cytology showed reactive mesothelial cells; acute and chronic inflammation. 11. Diarrhea and crampy abdominal pain following cycle 1 irinotecan/Panitumumab. He declined further irinotecan. Panitumumab continued. He continues to have diarrhea and crampy abdominal pain. 12. Chronic tachycardia.   Disposition: Tony Barajas was recently seen by Dr. Altamease Oiler at [facility]. They had no further treatment options for him. At today's visit ee discussed a supportive care approach with hospice  involvement.  He is agreeable. We will contact the [facility] program. We discussed CODE STATUS/end-of-life issues. Per his wishes he will be placed on NO CODE BLUE status.  His main complaint is "gas pain". He will continue Percocet as needed and begin Protonix 40 mg daily.  We scheduled a followup visit in 4 weeks. He will contact the office in the interim with any problems.  Patient seen with Dr. Benay Spice.  Owens Shark ANP/GNP-BC   03/10/2014  2:40 PM  This was a shared visit with Ned Card. I reviewed the [facility] visit with Tony Barajas. He would like to proceed with a comfort care approach. We made a [facility] referral. He will be placed on a no CODE BLUE status.  He will return for an office visit in 4 weeks. We will see him sooner as needed.  Julieanne Manson, M.D.

## 2014-03-10 NOTE — Progress Notes (Signed)
Faxed hospice referall to [facility] [phone]

## 2014-03-10 NOTE — Patient Instructions (Signed)

## 2014-03-10 NOTE — Telephone Encounter (Signed)
gv adn rpinted appt sched and avs for pt for July °

## 2014-03-12 ENCOUNTER — Encounter: Payer: Self-pay | Admitting: *Deleted

## 2014-03-12 NOTE — Progress Notes (Signed)
[place] Clinical Social Work  Clinical Social Work phoned pt to check in and discuss body donation form that pt had started for [employer]. Pt plans to return on 7/7 and CSW will meet with him on this date to witness form and complete.      Loren Racer, LCSW Clinical Social Worker [facility] [place] for [place] Wednesday, Thursday and Friday Phone: [phone] Fax: [phone]

## 2014-03-19 ENCOUNTER — Telehealth: Payer: Self-pay | Admitting: *Deleted

## 2014-03-19 DIAGNOSIS — C189 Malignant neoplasm of colon, unspecified: Secondary | ICD-10-CM

## 2014-03-19 NOTE — Telephone Encounter (Signed)
Patient requests out of facility DNR order. Need confirmation OK for Hospice MD to sign this. OK per Dr. Benay Spice.

## 2014-04-07 ENCOUNTER — Telehealth: Payer: Self-pay | Admitting: Oncology

## 2014-04-07 ENCOUNTER — Encounter: Payer: Self-pay | Admitting: *Deleted

## 2014-04-07 ENCOUNTER — Ambulatory Visit (HOSPITAL_BASED_OUTPATIENT_CLINIC_OR_DEPARTMENT_OTHER): Payer: 59 | Admitting: Oncology

## 2014-04-07 VITALS — BP 99/71 | HR 98 | Temp 97.5°F | Resp 16 | Ht 70.0 in | Wt 140.9 lb

## 2014-04-07 DIAGNOSIS — C787 Secondary malignant neoplasm of liver and intrahepatic bile duct: Secondary | ICD-10-CM

## 2014-04-07 DIAGNOSIS — F17200 Nicotine dependence, unspecified, uncomplicated: Secondary | ICD-10-CM

## 2014-04-07 DIAGNOSIS — C189 Malignant neoplasm of colon, unspecified: Secondary | ICD-10-CM

## 2014-04-07 DIAGNOSIS — C183 Malignant neoplasm of hepatic flexure: Secondary | ICD-10-CM

## 2014-04-07 DIAGNOSIS — D509 Iron deficiency anemia, unspecified: Secondary | ICD-10-CM

## 2014-04-07 DIAGNOSIS — R109 Unspecified abdominal pain: Secondary | ICD-10-CM

## 2014-04-07 DIAGNOSIS — R635 Abnormal weight gain: Secondary | ICD-10-CM

## 2014-04-07 NOTE — Progress Notes (Signed)
[place] Work  Clinical Social Work met with pt at follow up appointment to complete [employer] form for [employer]. Pt states that is his plan upon his death. Per form, the body donor or their estate will need to cover costs of transporting body. CSW explained this to pt. He stated understanding and agrees to pay for this cost as needed. He states his sister is aware of his plans as well. CSW made copy of form for pt and mailed original to [facility]. Pt plans to share copy with his sister who plans to carry out his arrangements. He reports he has a good team through [facility] and his CSW there is [place]. Pt appreciative and agrees to reach out if needed.   Clinical Social Work interventions: Emotional support Form completion Resource education  Loren Racer, LCSW Clinical Social Worker [facility] [place] for [place] Wednesday, Thursday and Friday Phone: [phone] Fax: [phone]

## 2014-04-07 NOTE — Progress Notes (Signed)
  [place] OFFICE PROGRESS NOTE   Diagnosis: Metastatic colon cancer  INTERVAL HISTORY:   He returns as scheduled. He is now followed at home by the [facility]. The abdominal pain is unchanged. His appetite has improved since discontinuing chemotherapy.  Objective:  Vital signs in last 24 hours:  Blood pressure 99/71, pulse 98, temperature 97.5 F (36.4 C), temperature source Oral, resp. rate 16, height $RemoveBe'5\' 10"'VGaEUkrWo$  (1.778 m), weight 140 lb 14.4 oz (63.912 kg).    HEENT: No thrush or ulcers Resp: Decreased breath sounds at the left lower chest, no respiratory. Dullness at the left lower posterior chest Cardio: Regular rate and rhythm GI: No hepatomegaly, mildly distended, tender in the right lower abdomen, no mass Vascular: 1+ pitting edema at the low leg and ankle bilaterally  Medications: I have reviewed the patient's current medications.  Assessment/Plan: 1. Hepatic flexure mass with extensive liver metastases. Status post a colonoscopic biopsy on 04/14/2013 confirming at least high-grade glandular dysplasia.  Status post liver biopsy 05/05/2013 confirming metastatic colorectal adenocarcinoma.  No K-ras mutation on extended testing.  Cycle 1 FOLFOX 05/14/2013.  Cycle 2 FOLFOX 05/27/2013.  Cycle 3 FOLFOX 06/10/2013.  Cycle 4 FOLFOX 06/24/2013.  Cycle 5 FOLFOX 07/15/2013.  Restaging CT evaluation 07/25/2013 showed some liver lesions were stable while others had increased in size. There was a generalized decrease in abdominal lymphadenopathy.  Cycle 6 of FOLFOX 07/29/2013.  Cycle 7 of FOLFOX 08/12/2013.  Cycle 8 FOLFOX 09/02/2013.  Cycle 9 FOLFOX (oxaliplatin held) 09/16/2013  CT 09/29/2013 with stable liver metastases, hepatic flexure mass, and adenopathy. Increased thickening at the ileum with surrounding ascites  Cycle 1 of FOLFIRI 09/29/2013  Cycle 2 FOLFIRI 10/14/2013  Cycle 3 FOLFIRI 10/28/2013  Cycle 4 FOLFIRI 11/11/2013.  Restaging CT  evaluation 12/18/2013 showed overall stable hepatic metastatic disease; no new lesions. Progressive abdominal lymphadenopathy; progressive hepatic flexure colon cancer and slightly progressive direct liver invasion; stable SMV thrombosis; interval development of large volume abdominal/pelvic ascites; left pleural effusion and bibasilar atelectasis.  Cycle 1 irinotecan/Panitumumab 12/30/2013.  Continuation of single agent panitumumab beginning 01/13/2014. 2. Microcytic anemia secondary to #1. He was transfused with PRBCs on 04/30/2013 when the hemoglobin returned at 6.5. He was also given a dose of IV iron. The hemoglobin is stable. 3. Hepatitis C positive. 4. Weight loss.  5. COPD. 6. Tobacco use. 7. History of alcohol use. 8. Nausea following cycle 7 FOLFOX-prolonged period, he reports relief of the nausea with Compazine.  9. Abdominal pain-progressive, potentially related to the liver metastases, colon mass, or inflammation at the terminal ileum. The terminal ileum inflammation could be related to vascular compromise. The abdominal pain improved following a paracentesis on 01/09/2014. 10. Status post paracentesis 01/09/2014. Cytology showed reactive mesothelial cells; acute and chronic inflammation. 11. Chronic tachycardia.  Disposition:  His performance status has improved since discontinuing chemotherapy. [name] is now followed by the [facility] program. The plan is to continue comfort care. I am concerned he may have a left pleural effusion. He does not appear dyspneic. He declined a chest x-ray today. He knows to contact us for dyspnea. [name] will return for an office visit on 04/30/2014. We will arrange for a Port-A-Cath flush via the home Hospice nurse.  Betsy Coder, MD  04/07/2014  1:12 PM

## 2014-04-07 NOTE — Telephone Encounter (Signed)
gv adn printed appt sched and avs for pt for July  °

## 2014-04-07 NOTE — Telephone Encounter (Signed)
gv adn rpinted appt sched adn avs for pt for July

## 2014-04-17 ENCOUNTER — Other Ambulatory Visit: Payer: Self-pay | Admitting: Nurse Practitioner

## 2014-04-17 ENCOUNTER — Telehealth: Payer: Self-pay | Admitting: *Deleted

## 2014-04-17 ENCOUNTER — Telehealth: Payer: Self-pay | Admitting: Nurse Practitioner

## 2014-04-17 DIAGNOSIS — C787 Secondary malignant neoplasm of liver and intrahepatic bile duct: Principal | ICD-10-CM

## 2014-04-17 DIAGNOSIS — C189 Malignant neoplasm of colon, unspecified: Secondary | ICD-10-CM

## 2014-04-17 NOTE — Telephone Encounter (Signed)
Tony Barajas received message from [name] regarding a patient wanting to schedule a chest xray on 7/30 before office visit with Tony Barajas.  Called patient regarding message.  Patient stated there was no change in the SOB and would like to get chest xray scheduled for the same day as appointment with Tony Barajas.  Informed patient we could do Chest xray sooner if needed and to call if SOB worsened.  Patient verbalized understanding.

## 2014-04-17 NOTE — Telephone Encounter (Signed)
FYI from nurse that apical pulse today is 142 and BP 86/54.  Patient did not want it reported saying "I'm not going to do a damn thing about it. Just let me die".

## 2014-04-17 NOTE — Telephone Encounter (Deleted)
Pt called states he wants to see if he is able to get a chest x-ray same day as visit w/Lisa Marcello Moores 07/30. I advised him there is no order for this and that he declined the x-ray the day of his last visit with 07/07. Pt would like for someone to call him to see if he can get this scheduled for same day office visit. Please call pt concerning this matter. Thanks

## 2014-04-17 NOTE — Telephone Encounter (Signed)
Disregard msg ........KJ

## 2014-04-21 ENCOUNTER — Emergency Department (HOSPITAL_COMMUNITY): Payer: 59

## 2014-04-21 ENCOUNTER — Inpatient Hospital Stay (HOSPITAL_COMMUNITY)
Admission: EM | Admit: 2014-04-21 | Discharge: 2014-04-24 | DRG: 175 | Disposition: A | Discharge status: Hospice | Payer: 59 | Attending: Family Medicine | Admitting: Family Medicine

## 2014-04-21 ENCOUNTER — Encounter (HOSPITAL_COMMUNITY): Payer: Self-pay | Admitting: Emergency Medicine

## 2014-04-21 DIAGNOSIS — E43 Unspecified severe protein-calorie malnutrition: Secondary | ICD-10-CM | POA: Insufficient documentation

## 2014-04-21 DIAGNOSIS — IMO0002 Reserved for concepts with insufficient information to code with codable children: Secondary | ICD-10-CM

## 2014-04-21 DIAGNOSIS — J90 Pleural effusion, not elsewhere classified: Secondary | ICD-10-CM | POA: Diagnosis present

## 2014-04-21 DIAGNOSIS — Z66 Do not resuscitate: Secondary | ICD-10-CM | POA: Diagnosis present

## 2014-04-21 DIAGNOSIS — I2699 Other pulmonary embolism without acute cor pulmonale: Principal | ICD-10-CM | POA: Diagnosis present

## 2014-04-21 DIAGNOSIS — F17200 Nicotine dependence, unspecified, uncomplicated: Secondary | ICD-10-CM | POA: Diagnosis present

## 2014-04-21 DIAGNOSIS — Z923 Personal history of irradiation: Secondary | ICD-10-CM

## 2014-04-21 DIAGNOSIS — C189 Malignant neoplasm of colon, unspecified: Secondary | ICD-10-CM

## 2014-04-21 DIAGNOSIS — J9600 Acute respiratory failure, unspecified whether with hypoxia or hypercapnia: Secondary | ICD-10-CM | POA: Diagnosis present

## 2014-04-21 DIAGNOSIS — E869 Volume depletion, unspecified: Secondary | ICD-10-CM

## 2014-04-21 DIAGNOSIS — K219 Gastro-esophageal reflux disease without esophagitis: Secondary | ICD-10-CM | POA: Diagnosis present

## 2014-04-21 DIAGNOSIS — E41 Nutritional marasmus: Secondary | ICD-10-CM | POA: Diagnosis present

## 2014-04-21 DIAGNOSIS — Z8249 Family history of ischemic heart disease and other diseases of the circulatory system: Secondary | ICD-10-CM

## 2014-04-21 DIAGNOSIS — I959 Hypotension, unspecified: Secondary | ICD-10-CM | POA: Diagnosis present

## 2014-04-21 DIAGNOSIS — R071 Chest pain on breathing: Secondary | ICD-10-CM

## 2014-04-21 DIAGNOSIS — C787 Secondary malignant neoplasm of liver and intrahepatic bile duct: Secondary | ICD-10-CM | POA: Diagnosis present

## 2014-04-21 DIAGNOSIS — Z9221 Personal history of antineoplastic chemotherapy: Secondary | ICD-10-CM

## 2014-04-21 DIAGNOSIS — R0602 Shortness of breath: Secondary | ICD-10-CM

## 2014-04-21 DIAGNOSIS — R0600 Dyspnea, unspecified: Secondary | ICD-10-CM | POA: Diagnosis present

## 2014-04-21 LAB — COMPREHENSIVE METABOLIC PANEL
ALT: 12 U/L (ref 0–53)
AST: 32 U/L (ref 0–37)
Albumin: 1.3 g/dL — ABNORMAL LOW (ref 3.5–5.2)
Alkaline Phosphatase: 173 U/L — ABNORMAL HIGH (ref 39–117)
Anion gap: 10 (ref 5–15)
BUN: 6 mg/dL (ref 6–23)
CALCIUM: 7.4 mg/dL — AB (ref 8.4–10.5)
CO2: 22 mEq/L (ref 19–32)
CREATININE: 0.65 mg/dL (ref 0.50–1.35)
Chloride: 103 mEq/L (ref 96–112)
GFR calc Af Amer: >90 mL/min (ref >90)
GFR calc non Af Amer: >90 mL/min (ref >90)
Glucose, Bld: 83 mg/dL (ref 70–99)
Potassium: 3.6 mEq/L — ABNORMAL LOW (ref 3.7–5.3)
Sodium: 135 mEq/L — ABNORMAL LOW (ref 137–147)
Total Bilirubin: 1.3 mg/dL — ABNORMAL HIGH (ref 0.3–1.2)
Total Protein: 5.7 g/dL — ABNORMAL LOW (ref 6.0–8.3)

## 2014-04-21 LAB — CBC WITH DIFFERENTIAL/PLATELET
BASOS ABS: 0 10*3/uL (ref 0.0–0.1)
Basophils Relative: 0 % (ref 0–1)
EOS ABS: 0 10*3/uL (ref 0.0–0.7)
Eosinophils Relative: 0 % (ref 0–5)
HEMATOCRIT: 33 % — AB (ref 39.0–52.0)
Hemoglobin: 11.3 g/dL — ABNORMAL LOW (ref 13.0–17.0)
Lymphocytes Relative: 13 % (ref 12–46)
Lymphs Abs: 1.7 10*3/uL (ref 0.7–4.0)
MCH: 31.1 pg (ref 26.0–34.0)
MCHC: 34.2 g/dL (ref 30.0–36.0)
MCV: 90.9 fL (ref 78.0–100.0)
MONO ABS: 1 10*3/uL (ref 0.1–1.0)
Monocytes Relative: 8 % (ref 3–12)
NEUTROS ABS: 10.1 10*3/uL — AB (ref 1.7–7.7)
Neutrophils Relative %: 79 % — ABNORMAL HIGH (ref 43–77)
PLATELETS: 373 10*3/uL (ref 150–400)
RBC: 3.63 MIL/uL — ABNORMAL LOW (ref 4.22–5.81)
RDW: 18.3 % — ABNORMAL HIGH (ref 11.5–15.5)
WBC: 12.8 10*3/uL — ABNORMAL HIGH (ref 4.0–10.5)

## 2014-04-21 LAB — URINALYSIS, ROUTINE W REFLEX MICROSCOPIC
BILIRUBIN URINE: NEGATIVE
Glucose, UA: NEGATIVE mg/dL
Hgb urine dipstick: NEGATIVE
KETONES UR: NEGATIVE mg/dL
LEUKOCYTES UA: NEGATIVE
NITRITE: NEGATIVE
PH: 6.5 (ref 5.0–8.0)
Protein, ur: NEGATIVE mg/dL
SPECIFIC GRAVITY, URINE: 1.006 (ref 1.005–1.030)
Urobilinogen, UA: 1 mg/dL (ref 0.0–1.0)

## 2014-04-21 LAB — PRO B NATRIURETIC PEPTIDE: PRO B NATRI PEPTIDE: 417.1 pg/mL — AB (ref 0–125)

## 2014-04-21 LAB — I-STAT CG4 LACTIC ACID, ED: Lactic Acid, Venous: 1.29 mmol/L (ref 0.5–2.2)

## 2014-04-21 MED ORDER — ONDANSETRON HCL 4 MG/2ML IJ SOLN
4.0000 mg | Freq: Four times a day (QID) | INTRAMUSCULAR | Status: DC | PRN
  Administered 2014-04-22 – 2014-04-23 (×2): 4 mg via INTRAVENOUS
  Filled 2014-04-21 (×2): qty 2

## 2014-04-21 MED ORDER — FERROUS SULFATE 325 (65 FE) MG PO TABS
325.0000 mg | ORAL_TABLET | Freq: Two times a day (BID) | ORAL | Status: DC
  Administered 2014-04-21 – 2014-04-23 (×5): 325 mg via ORAL
  Filled 2014-04-21 (×7): qty 1

## 2014-04-21 MED ORDER — IOHEXOL 300 MG/ML  SOLN: 100.0000 mL | Freq: Once | INTRAMUSCULAR | Status: AC | PRN

## 2014-04-21 MED ORDER — MORPHINE SULFATE 2 MG/ML IJ SOLN
1.0000 mg | INTRAMUSCULAR | Status: DC | PRN
  Administered 2014-04-21 – 2014-04-22 (×2): 1 mg via INTRAVENOUS
  Filled 2014-04-21 (×2): qty 1

## 2014-04-21 MED ORDER — DOCUSATE SODIUM 100 MG PO CAPS
100.0000 mg | ORAL_CAPSULE | Freq: Two times a day (BID) | ORAL | Status: DC
  Administered 2014-04-21 – 2014-04-23 (×5): 100 mg via ORAL
  Filled 2014-04-21 (×7): qty 1

## 2014-04-21 MED ORDER — SENNA 8.6 MG PO TABS
1.0000 | ORAL_TABLET | Freq: Every day | ORAL | Status: DC
  Administered 2014-04-22 – 2014-04-23 (×2): 17.2 mg via ORAL
  Filled 2014-04-21 (×2): qty 2

## 2014-04-21 MED ORDER — OXYCODONE-ACETAMINOPHEN 5-325 MG PO TABS
1.0000 | ORAL_TABLET | ORAL | Status: DC | PRN
  Administered 2014-04-21 – 2014-04-24 (×4): 2 via ORAL
  Filled 2014-04-21 (×4): qty 2

## 2014-04-21 MED ORDER — SODIUM CHLORIDE 0.9 % IV SOLN
INTRAVENOUS | Status: DC
  Administered 2014-04-21: 23:00:00 via INTRAVENOUS

## 2014-04-21 MED ORDER — ONDANSETRON HCL 4 MG PO TABS: 4.0000 mg | ORAL_TABLET | Freq: Four times a day (QID) | ORAL | Status: DC | PRN

## 2014-04-21 MED ORDER — OXYCODONE-ACETAMINOPHEN 10-325 MG PO TABS: 1.0000 | ORAL_TABLET | ORAL | Status: DC | PRN

## 2014-04-21 MED ORDER — SODIUM CHLORIDE 0.9 % IV SOLN
INTRAVENOUS | Status: DC
  Administered 2014-04-21: 125 mL/h via INTRAVENOUS

## 2014-04-21 MED ORDER — SODIUM CHLORIDE 0.9 % IV BOLUS (SEPSIS)
1000.0000 mL | Freq: Once | INTRAVENOUS | Status: AC
  Administered 2014-04-21: 1000 mL via INTRAVENOUS

## 2014-04-21 MED ORDER — OXYCODONE HCL 5 MG PO TABS
5.0000 mg | ORAL_TABLET | ORAL | Status: DC | PRN
  Administered 2014-04-22: 10 mg via ORAL
  Administered 2014-04-22: 5 mg via ORAL
  Administered 2014-04-23: 10 mg via ORAL
  Administered 2014-04-24: 5 mg via ORAL
  Filled 2014-04-21: qty 2
  Filled 2014-04-21 (×2): qty 1
  Filled 2014-04-21: qty 2

## 2014-04-21 MED ORDER — ZOLPIDEM TARTRATE 5 MG PO TABS
5.0000 mg | ORAL_TABLET | Freq: Every evening | ORAL | Status: DC | PRN
  Administered 2014-04-21 – 2014-04-23 (×3): 5 mg via ORAL
  Filled 2014-04-21 (×4): qty 1

## 2014-04-21 MED ORDER — IOHEXOL 350 MG/ML SOLN
100.0000 mL | Freq: Once | INTRAVENOUS | Status: AC | PRN
  Administered 2014-04-21: 100 mL via INTRAVENOUS

## 2014-04-21 MED ORDER — POTASSIUM CHLORIDE CRYS ER 20 MEQ PO TBCR
20.0000 meq | EXTENDED_RELEASE_TABLET | Freq: Every day | ORAL | Status: DC
  Administered 2014-04-22 – 2014-04-23 (×2): 20 meq via ORAL
  Filled 2014-04-21 (×2): qty 1

## 2014-04-21 MED ORDER — LIDOCAINE-PRILOCAINE 2.5-2.5 % EX CREA: TOPICAL_CREAM | CUTANEOUS | Status: DC | PRN

## 2014-04-21 MED ORDER — ENOXAPARIN SODIUM 40 MG/0.4ML ~~LOC~~ SOLN
40.0000 mg | Freq: Every day | SUBCUTANEOUS | Status: DC
  Filled 2014-04-21: qty 0.4

## 2014-04-21 MED ORDER — PANTOPRAZOLE SODIUM 40 MG PO TBEC
40.0000 mg | DELAYED_RELEASE_TABLET | Freq: Every day | ORAL | Status: DC
  Administered 2014-04-22 – 2014-04-23 (×2): 40 mg via ORAL
  Filled 2014-04-21 (×3): qty 1

## 2014-04-21 MED ORDER — ENOXAPARIN SODIUM 80 MG/0.8ML ~~LOC~~ SOLN
1.0000 mg/kg | Freq: Two times a day (BID) | SUBCUTANEOUS | Status: DC
  Administered 2014-04-21 – 2014-04-23 (×5): 65 mg via SUBCUTANEOUS
  Filled 2014-04-21 (×7): qty 0.8

## 2014-04-21 NOTE — Progress Notes (Signed)
  CARE MANAGEMENT ED NOTE 04/21/2014  Patient:  Tony Barajas, Tony Barajas   Account Number:  [id number]  Date Initiated:  04/21/2014  Documentation initiated by:  Livia Snellen  Subjective/Objective Assessment:   Patient presents to Ed with increased weakness, dizziness and shortness of breath over the last month     Subjective/Objective Assessment Detail:   Patient with pmhx of metastatic colon cancer to liver     Action/Plan:   Action/Plan Detail:   Anticipated DC Date:       Status Recommendation to Physician:   Result of Recommendation:    Other ED Services  Consult Working [place]  CM consult  Other    Choice offered to / List presented to:            Status of service:  Completed, signed off  ED Comments:   ED Comments Detail:  EDCM spoke to patient at bedside.  Patient confirms helives alone and is receiving home hospice.  Patient does not recall the name of his hospice provider.  Upon chart review, patient is receiving home hospice with [facility]. Patient reports he does not have any family who come and check in on him.  Patient reports up until today, he was able to perform his own ADL's without difficulty.  Patient reports he doe snot have any medical equipment at home. "My landlord has a walker and a cane he can give me."  [facility] asked patient if he would prefer to go home when he is discharged.  Patient responded, "Well, it's a little too early to tell that right now."  No further [facility] needs at this time.

## 2014-04-21 NOTE — ED Notes (Signed)
Bed: TH43 Expected date:  Expected time:  Means of arrival:  Comments: EMS/near syncope/ca pt
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
## 2014-04-21 NOTE — ED Notes (Signed)
Patient is made aware that an urine sample is needed and is given a urinal. Patient is encouraged to void when able.

## 2014-04-21 NOTE — ED Notes (Addendum)
Per EMS pt hx of colon  cancer with increased weakness, dizziness, SOB over past month. Pt complaint of multiple recent falls. Pt denies head injury.

## 2014-04-21 NOTE — ED Notes (Signed)
MD Zenia Resides aware of vitals and complaint. Per MD will place orders.

## 2014-04-22 DIAGNOSIS — F17200 Nicotine dependence, unspecified, uncomplicated: Secondary | ICD-10-CM | POA: Diagnosis present

## 2014-04-22 DIAGNOSIS — Z9221 Personal history of antineoplastic chemotherapy: Secondary | ICD-10-CM | POA: Diagnosis not present

## 2014-04-22 DIAGNOSIS — Z923 Personal history of irradiation: Secondary | ICD-10-CM | POA: Diagnosis not present

## 2014-04-22 DIAGNOSIS — C787 Secondary malignant neoplasm of liver and intrahepatic bile duct: Secondary | ICD-10-CM | POA: Diagnosis present

## 2014-04-22 DIAGNOSIS — R0602 Shortness of breath: Secondary | ICD-10-CM | POA: Diagnosis present

## 2014-04-22 DIAGNOSIS — K219 Gastro-esophageal reflux disease without esophagitis: Secondary | ICD-10-CM | POA: Diagnosis present

## 2014-04-22 DIAGNOSIS — Z66 Do not resuscitate: Secondary | ICD-10-CM | POA: Diagnosis present

## 2014-04-22 DIAGNOSIS — E41 Nutritional marasmus: Secondary | ICD-10-CM | POA: Diagnosis present

## 2014-04-22 DIAGNOSIS — J9600 Acute respiratory failure, unspecified whether with hypoxia or hypercapnia: Secondary | ICD-10-CM | POA: Diagnosis present

## 2014-04-22 DIAGNOSIS — C189 Malignant neoplasm of colon, unspecified: Secondary | ICD-10-CM | POA: Diagnosis present

## 2014-04-22 DIAGNOSIS — Z8249 Family history of ischemic heart disease and other diseases of the circulatory system: Secondary | ICD-10-CM | POA: Diagnosis not present

## 2014-04-22 DIAGNOSIS — IMO0002 Reserved for concepts with insufficient information to code with codable children: Secondary | ICD-10-CM | POA: Diagnosis not present

## 2014-04-22 DIAGNOSIS — I959 Hypotension, unspecified: Secondary | ICD-10-CM | POA: Diagnosis present

## 2014-04-22 DIAGNOSIS — J90 Pleural effusion, not elsewhere classified: Secondary | ICD-10-CM | POA: Diagnosis present

## 2014-04-22 DIAGNOSIS — I2699 Other pulmonary embolism without acute cor pulmonale: Secondary | ICD-10-CM | POA: Diagnosis present

## 2014-04-22 LAB — URINE CULTURE
COLONY COUNT: NO GROWTH
Culture: NO GROWTH

## 2014-04-22 LAB — BASIC METABOLIC PANEL
ANION GAP: 11 (ref 5–15)
BUN: 6 mg/dL (ref 6–23)
CO2: 21 meq/L (ref 19–32)
Calcium: 7.4 mg/dL — ABNORMAL LOW (ref 8.4–10.5)
Chloride: 106 mEq/L (ref 96–112)
Creatinine, Ser: 0.71 mg/dL (ref 0.50–1.35)
GFR calc non Af Amer: >90 mL/min (ref >90)
GLUCOSE: 83 mg/dL (ref 70–99)
Potassium: 3.6 mEq/L — ABNORMAL LOW (ref 3.7–5.3)
Sodium: 138 mEq/L (ref 137–147)

## 2014-04-22 LAB — CBC
HCT: 32.2 % — ABNORMAL LOW (ref 39.0–52.0)
HEMOGLOBIN: 10.7 g/dL — AB (ref 13.0–17.0)
MCH: 30.8 pg (ref 26.0–34.0)
MCHC: 33.2 g/dL (ref 30.0–36.0)
MCV: 92.8 fL (ref 78.0–100.0)
PLATELETS: 344 10*3/uL (ref 150–400)
RBC: 3.47 MIL/uL — ABNORMAL LOW (ref 4.22–5.81)
RDW: 18.5 % — ABNORMAL HIGH (ref 11.5–15.5)
WBC: 13.6 10*3/uL — AB (ref 4.0–10.5)

## 2014-04-22 LAB — PROTIME-INR
INR: 1.73 — AB (ref 0.00–1.49)
PROTHROMBIN TIME: 20.3 s — AB (ref 11.6–15.2)

## 2014-04-22 MED ORDER — MORPHINE SULFATE 2 MG/ML IJ SOLN
1.0000 mg | INTRAMUSCULAR | Status: DC | PRN
  Administered 2014-04-23: 1 mg via INTRAVENOUS
  Filled 2014-04-22: qty 1

## 2014-04-22 MED ORDER — BIOTENE DRY MOUTH MT LIQD
15.0000 mL | Freq: Two times a day (BID) | OROMUCOSAL | Status: DC
  Administered 2014-04-23 – 2014-04-24 (×2): 15 mL via OROMUCOSAL

## 2014-04-22 MED ORDER — ENSURE COMPLETE PO LIQD
237.0000 mL | Freq: Two times a day (BID) | ORAL | Status: DC
  Administered 2014-04-23: 237 mL via ORAL

## 2014-04-22 MED ORDER — SODIUM CHLORIDE 0.9 % IJ SOLN
10.0000 mL | INTRAMUSCULAR | Status: DC | PRN
  Administered 2014-04-22: 30 mL

## 2014-04-22 MED ORDER — SODIUM CHLORIDE 0.9 % IV SOLN
Freq: Once | INTRAVENOUS | Status: AC
  Administered 2014-04-22: via INTRAVENOUS

## 2014-04-22 NOTE — Progress Notes (Signed)
[facility] PROGRESS NOTE  Tony Barajas IZT:[id number] DOB: 04/20/1956 DOA: 04/21/2014 PCP: Thurman Coyer, MD  Assessment/Plan: Active Problems:   Shortness of breath - Please see dyspnea discussion below. We'll continue supplemental oxygen    Dyspnea - Most likely secondary to pleural effusion and new diagnosis of pulmonary embolus.    Pulmonary embolus : principle problem - continue anticoagulation with lovenox  Code Status: DNR Family Communication: None at bedside. Disposition Plan: discuss with team and make safe discharge plans then once that is established discharge on lovenox   Consultants:  None  Procedures:  None  Antibiotics:  None  HPI/Subjective: No new complaints. No acute issues reported overnight.  Objective: Filed Vitals:   04/22/14 1428  BP: 97/73  Pulse: 118  Temp: 97.5 F (36.4 C)  Resp: 20    Intake/Output Summary (Last 24 hours) at 04/22/14 1725 Last data filed at 04/22/14 1300  Gross per 24 hour  Intake 1991.25 ml  Output      0 ml  Net 1991.25 ml   Filed Weights   04/21/14 2246  Weight: 64.683 kg (142 lb 9.6 oz)    Exam:   General:  Pt in nad, alert and awake  Cardiovascular: rrr, no mrg  Respiratory: [place] in place, decreased breath sounds at bases, no audible wheezes  Abdomen: soft, NT, ND  Musculoskeletal: no cyanosis or clubbing   Data Reviewed: Basic Metabolic Panel:  Recent Labs Lab 04/21/14 1603 04/22/14 0626  NA 135* 138  K 3.6* 3.6*  CL 103 106  CO2 22 21  GLUCOSE 83 83  BUN 6 6  CREATININE 0.65 0.71  CALCIUM 7.4* 7.4*   Liver Function Tests:  Recent Labs Lab 04/21/14 1603  AST 32  ALT 12  ALKPHOS 173*  BILITOT 1.3*  PROT 5.7*  ALBUMIN 1.3*   No results found for this basename: LIPASE, AMYLASE,  in the last 168 hours No results found for this basename: AMMONIA,  in the last 168 hours CBC:  Recent Labs Lab 04/21/14 1603 04/22/14 0626  WBC 12.8* 13.6*  NEUTROABS 10.1*  --    HGB 11.3* 10.7*  HCT 33.0* 32.2*  MCV 90.9 92.8  PLT 373 344   Cardiac Enzymes: No results found for this basename: CKTOTAL, CKMB, CKMBINDEX, TROPONINI,  in the last 168 hours BNP (last 3 results)  Recent Labs  04/21/14 1603  PROBNP 417.1*   CBG: No results found for this basename: GLUCAP,  in the last 168 hours  Recent Results (from the past 240 hour(s))  CULTURE, BLOOD (ROUTINE X 2)     Status: None   Collection Time    04/21/14  4:03 PM      Result Value Ref Range Status   Specimen Description BLOOD RIGHT PORTA CATH   Final   Special Requests BOTTLES DRAWN AEROBIC AND ANAEROBIC 3ML   Final   Culture  Setup Time     Final   Value: 04/21/2014 19:38     Performed at [facility]   Culture     Final   Value:        BLOOD CULTURE RECEIVED NO GROWTH TO DATE CULTURE WILL BE HELD FOR 5 DAYS BEFORE ISSUING A FINAL NEGATIVE REPORT     Performed at [facility]   Report Status PENDING   Incomplete  CULTURE, BLOOD (ROUTINE X 2)     Status: None   Collection Time    04/21/14  4:09 PM  Result Value Ref Range Status   Specimen Description BLOOD LEFT ANTECUBITAL   Final   Special Requests BOTTLES DRAWN AEROBIC ONLY 3ML   Final   Culture  Setup Time     Final   Value: 04/21/2014 19:38     Performed at [facility]   Culture     Final   Value:        BLOOD CULTURE RECEIVED NO GROWTH TO DATE CULTURE WILL BE HELD FOR 5 DAYS BEFORE ISSUING A FINAL NEGATIVE REPORT     Performed at [facility]   Report Status PENDING   Incomplete     Studies: Ct Angio Chest Pe W/cm &/or Wo Cm  04/21/2014   CLINICAL DATA:  Shortness breath with cough for 1 day. History of metastatic cancer in hospice care.  EXAM: CT ANGIOGRAPHY CHEST WITH CONTRAST  TECHNIQUE: Multidetector CT imaging of the chest was performed using the standard protocol during bolus administration of intravenous contrast. Multiplanar CT image reconstructions and MIPs were obtained to evaluate the  vascular anatomy.  CONTRAST:  118mL OMNIPAQUE IOHEXOL 350 MG/ML SOLN  COMPARISON:  Radiographs today. Chest CT 04/15/2013. Abdominal CT 12/18/2013.  FINDINGS: Vascular: The pulmonary arteries are well opacified with contrast. There are definite areas of acute thromboembolic disease within the right lung, primarily within the lower lobe. No definite left lung involvement is demonstrated. The central pulmonary arteries are patent. There is no significant atherosclerosis.  Mediastinum: Right IJ Port-A-Cath extends into the upper right atrium. No enlarged mediastinal, hilar or axillary lymph nodes are seen. The heart size is normal.  Lungs/Pleura: As demonstrated radiographically, there is a new large left pleural effusion with resulting subtotal collapse of the left lung. No pleural nodularity is apparent. There is no evidence of endobronchial lesion or nodule within the aerated portion of the left upper lobe. There is a small right pleural effusion. There are new irregular ground-glass densities within the right upper, middle and lower lobes.No endobronchial lesion or significant pleural effusion is identified.  Upper abdomen: Since the prior abdominal CT, there is increasing ascites within the upper abdomen. Extensive metastatic disease to the liver is again demonstrated, primarily within the left lobe. There is severe hepatic steatosis. The adrenal glands are not imaged.  Musculoskeletal/Chest wall: Patient appears cachectic with generalized soft tissue edema. No focal soft tissue abnormality identified.No osseous metastases are seen.  Review of the MIP images confirms the above findings.  IMPRESSION: 1. Study is positive for acute pulmonary embolism within the right lung. There are no signs of right heart strain. 2. New large left pleural effusion with resulting subtotal collapse of the left lung. 3. Small right pleural effusion with irregular ground-glass densities centrally in the right lung, possibly related to  prior radiation therapy. No typical signs of local recurrence or metastatic disease within the chest. 4. Extensive hepatic metastatic disease with increasing ascites. 5. Critical Value/emergent results were called by telephone at the time of interpretation on 04/21/2014 at 10:41 pm to Dr. Lacretia Leigh , who verbally acknowledged these results.   Electronically Signed   By: Camie Patience M.D.   On: 04/21/2014 22:41   Dg Chest Port 1 View  04/21/2014   CLINICAL DATA:  Metastatic colonic malignancy; generalized pain and fatigue  EXAM: PORTABLE CHEST - 1 VIEW  COMPARISON:  CT scan of the chest of April 15, 2013  FINDINGS: There is a moderate-sized left pleural effusion which has appeared since the previous study. Increased density in the  left perihilar region is present. The left heart border is obscured. On the right the interstitial markings are mildly increased. The power port appliance tip lies in the distal SVC.  IMPRESSION: There is a new left pleural effusion. A left hilar mass and/or left lower lobe atelectasis is present.   Electronically Signed   By: David  Martinique   On: 04/21/2014 16:47    Scheduled Meds: . Derrill Memo ON 04/23/2014] antiseptic oral rinse  15 mL Mouth Rinse BID  . docusate sodium  100 mg Oral BID  . enoxaparin (LOVENOX) injection  1 mg/kg Subcutaneous Q12H  . feeding supplement (ENSURE COMPLETE)  237 mL Oral BID BM  . ferrous sulfate  325 mg Oral BID  . pantoprazole  40 mg Oral Daily  . potassium chloride SA  20 mEq Oral Daily  . senna  1-2 tablet Oral Daily   Continuous Infusions:    Time spent:> 35 minutes    Tony Barajas  [facility] Pager [phone]. If 7PM-7AM, please contact night-coverage at [email or web address], password [facility] 04/22/2014, 5:25 PM  LOS: 1 day
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
## 2014-04-22 NOTE — Progress Notes (Signed)
Inpatient RN visit- [facility] RN Visit-Karen Alford Highland RN  Related noncovered admission to [facility] diagnosis of Colon Ca w/liver mets. Pt is DNR code.    Pt seen at bedside, drowsy, able to engage and answer questions. Pt reports he is unaware of plan of care as far as treatment and voiced frustration at being hospitalized. Per chart review he has both a  pulmonary embolism in his R lung w/small pleural effusion and a large pleural effusion in his L lung, as well as ascites. He did not appear short of breath with conversation and denied any sob. Pt reported that his sister and brother have been made aware Pt stated "I just want to get out of here and go for comfort". Emotional support offered. Pt reported "all over pain" of a 4/5, staff RN Simona Huh made aware and will f/u. Per writers conversation with staff RN Simona Huh there are no interventions planned at this time other than lovenox injections BID. [facility] will continue to follow through discharge. Patient's home medication list, transfer summary and OOF DNR in place on shadow chart.   Please call [facility] @ [phone]-  with any hospice needs.   Thank you. Tracey Harries, RN  [facility]  Hospice Liaison  [phone])
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
## 2014-04-22 NOTE — Progress Notes (Signed)
RM [place] MSW Note:  Pt reported feeling generally uncomfortable. Denied any needs. He expressed that he just desires to get to the last place he will be (living) because he does not want to be bounced around from place to place. He is aware that he can not return home without assistance. He does not want to burden his family with his care or any needs.  Also, discussed pt's desires for body donation. Prior to the visit, discussed the same with hospital CSW Roselee Nova, for additional information as she had previously assisted pt with the body donation paperwork.  Pt informed that he has a copy of the paperwork in his home and it is still his wish upon death for his body to be donated to [facility]. SW will follow up with pt to extend support. Benedict Needy Truesdale,LCSW

## 2014-04-22 NOTE — Progress Notes (Signed)
UR completed 

## 2014-04-23 ENCOUNTER — Telehealth: Payer: Self-pay | Admitting: *Deleted

## 2014-04-23 DIAGNOSIS — E43 Unspecified severe protein-calorie malnutrition: Secondary | ICD-10-CM | POA: Insufficient documentation

## 2014-04-23 MED ORDER — KCL IN DEXTROSE-NACL 20-5-0.45 MEQ/L-%-% IV SOLN
INTRAVENOUS | Status: DC
  Administered 2014-04-23: 100 mL/h via INTRAVENOUS
  Filled 2014-04-23 (×4): qty 1000

## 2014-04-23 MED ORDER — LORAZEPAM 2 MG/ML IJ SOLN
0.5000 mg | INTRAMUSCULAR | Status: DC | PRN
  Administered 2014-04-24 (×3): 0.5 mg via INTRAVENOUS
  Filled 2014-04-23 (×3): qty 1

## 2014-04-23 MED ORDER — SODIUM CHLORIDE 0.9 % IV BOLUS (SEPSIS)
250.0000 mL | Freq: Once | INTRAVENOUS | Status: AC
  Administered 2014-04-23: 250 mL via INTRAVENOUS

## 2014-04-23 NOTE — Progress Notes (Addendum)
Inpatient RN visit- [facility] RN Visit-Karen Alford [place] RN  Related noncovered admission to [facility] diagnosis of Colon Ca w/mets. Pt is DNR code.  Pt seen at bedside, sitting up on the side of the bed, basin in his lap. Pt reports "feeling terrible".  Staff RN Simona Huh alerted and gave antiemetic and pain medication per Md orders. Pt continued to be very restless and anxious. Writer assisted patient to the recliner next to his bed. Pt very weak. Writer spoke with attending MD Dr.Vega regarding patient's anxiety and restlessness, he will evaluate for addition of an antianxiety medication as pt does not currently have one ordered.  Discussed possibility of [facility]) with patient, he was agreeable. He stated "I just want to get out of here and get on with this". Writer also discussed with attending, who feels pt would be appropriate. Information faxed to [facility] at [place] request for evaluation of [facility] placement by [place] Director.  Information shared with CMRN Rod Holler and SW [place]. [facility] will continue to follow through discharge.  Patient's home medication list, transfer summary and OOF DNR  in place on shadow chart.   Please call [facility] @ [phone] with any hospice needs.   Thank you. Tracey Harries, RN  [facility]  Hospice Liaison  [phone])

## 2014-04-23 NOTE — Progress Notes (Signed)
Clinical Social Work  CSW received referral to assist with DC planning. CSW spoke with hospice RN Santiago Glad) who reports hospice SW will assess patient for [facility] referral. CSW will continue to follow and will assist with any further needs.  [place], [place] [phone]

## 2014-04-23 NOTE — Clinical Documentation Improvement (Signed)
Possible Clinical Conditions?  Acute Respiratory Failure Acute on Chronic Respiratory Failure Chronic Respiratory Failure Acute Respiratory Insufficiency   Other Condition Cannot Clinically Determine   Supporting Information: per H&P: "Respiratory: Positive for cough and shortness of breath. Respiratory: No stridor. He is in respiratory distress"  ED MD note: "here complaining of shortness of breath and cough x24 hours. Cough has been nonproductive and his dyspnea on exertion  Respiratory failure - supplementary O2"  Per ED staff note:SOB over past month.   Risk Factors:Pulmonary embolus,  Left sided pleural effusion    Treatment: 04/21/14 O2 @ 3 liters/[place] with O2 sat increasing to 98%  Thank You, Philippa Chester ,RN Clinical Documentation Specialist:  [place] Information Management

## 2014-04-23 NOTE — Progress Notes (Signed)
[facility] PROGRESS NOTE  Tony Barajas WUJ:[id number] DOB: 1955/12/05 DOA: 04/21/2014 PCP: Thurman Coyer, MD  Assessment/Plan: Active Problems:   Shortness of breath - Please see dyspnea discussion below. We'll continue supplemental oxygen  Hypotension - bolus and place on MIVF's - will discontinue morphine given low blood pressures.    Dyspnea - Most likely secondary to pleural effusion and new diagnosis of pulmonary embolus. - Will have to be careful with fluid rehydration    Pulmonary embolus : principle problem - continue anticoagulation with lovenox  severe MALNUTRITION  - RD on board - Ensure complete BID  Code Status: DNR Family Communication: None at bedside. Disposition Plan: discuss with team and make safe discharge plans then once that is established discharge on lovenox   Consultants:  None  Procedures:  None  Antibiotics:  None  HPI/Subjective: Pt has been taking supplemental oxygen off. He reports he likes to take it off every so often. I recommended he continue using it.  Objective: Filed Vitals:   04/23/14 1403  BP: 83/46  Pulse: 82  Temp:   Resp: 20    Intake/Output Summary (Last 24 hours) at 04/23/14 1445 Last data filed at 04/22/14 1824  Gross per 24 hour  Intake    120 ml  Output      0 ml  Net    120 ml   Filed Weights   04/21/14 2246  Weight: 64.683 kg (142 lb 9.6 oz)    Exam:   General:  Pt in nad, alert and awake  Cardiovascular: rrr, no mrg  Respiratory: decreased breath sounds at bases, no audible wheezes  Abdomen: soft, NT, ND  Musculoskeletal: no cyanosis or clubbing   Data Reviewed: Basic Metabolic Panel:  Recent Labs Lab 04/21/14 1603 04/22/14 0626  NA 135* 138  K 3.6* 3.6*  CL 103 106  CO2 22 21  GLUCOSE 83 83  BUN 6 6  CREATININE 0.65 0.71  CALCIUM 7.4* 7.4*   Liver Function Tests:  Recent Labs Lab 04/21/14 1603  AST 32  ALT 12  ALKPHOS 173*  BILITOT 1.3*  PROT 5.7*   ALBUMIN 1.3*   No results found for this basename: LIPASE, AMYLASE,  in the last 168 hours No results found for this basename: AMMONIA,  in the last 168 hours CBC:  Recent Labs Lab 04/21/14 1603 04/22/14 0626  WBC 12.8* 13.6*  NEUTROABS 10.1*  --   HGB 11.3* 10.7*  HCT 33.0* 32.2*  MCV 90.9 92.8  PLT 373 344   Cardiac Enzymes: No results found for this basename: CKTOTAL, CKMB, CKMBINDEX, TROPONINI,  in the last 168 hours BNP (last 3 results)  Recent Labs  04/21/14 1603  PROBNP 417.1*   CBG: No results found for this basename: GLUCAP,  in the last 168 hours  Recent Results (from the past 240 hour(s))  CULTURE, BLOOD (ROUTINE X 2)     Status: None   Collection Time    04/21/14  4:03 PM      Result Value Ref Range Status   Specimen Description BLOOD RIGHT PORTA CATH   Final   Special Requests BOTTLES DRAWN AEROBIC AND ANAEROBIC 3ML   Final   Culture  Setup Time     Final   Value: 04/21/2014 19:38     Performed at [facility]   Culture     Final   Value:        BLOOD CULTURE RECEIVED NO GROWTH TO DATE CULTURE WILL BE HELD FOR  5 DAYS BEFORE ISSUING A FINAL NEGATIVE REPORT     Performed at [facility]   Report Status PENDING   Incomplete  CULTURE, BLOOD (ROUTINE X 2)     Status: None   Collection Time    04/21/14  4:09 PM      Result Value Ref Range Status   Specimen Description BLOOD LEFT ANTECUBITAL   Final   Special Requests BOTTLES DRAWN AEROBIC ONLY 3ML   Final   Culture  Setup Time     Final   Value: 04/21/2014 19:38     Performed at [facility]   Culture     Final   Value:        BLOOD CULTURE RECEIVED NO GROWTH TO DATE CULTURE WILL BE HELD FOR 5 DAYS BEFORE ISSUING A FINAL NEGATIVE REPORT     Performed at [facility]   Report Status PENDING   Incomplete  URINE CULTURE     Status: None   Collection Time    04/21/14  5:49 PM      Result Value Ref Range Status   Specimen Description URINE, CLEAN CATCH   Final   Special  Requests NONE   Final   Culture  Setup Time     Final   Value: 04/22/2014 01:11     Performed at [facility]     Final   Value: NO GROWTH     Performed at [facility]   Culture     Final   Value: NO GROWTH     Performed at [facility]   Report Status 04/22/2014 FINAL   Final     Studies: Ct Angio Chest Pe W/cm &/or Wo Cm  04/21/2014   CLINICAL DATA:  Shortness breath with cough for 1 day. History of metastatic cancer in hospice care.  EXAM: CT ANGIOGRAPHY CHEST WITH CONTRAST  TECHNIQUE: Multidetector CT imaging of the chest was performed using the standard protocol during bolus administration of intravenous contrast. Multiplanar CT image reconstructions and MIPs were obtained to evaluate the vascular anatomy.  CONTRAST:  159mL OMNIPAQUE IOHEXOL 350 MG/ML SOLN  COMPARISON:  Radiographs today. Chest CT 04/15/2013. Abdominal CT 12/18/2013.  FINDINGS: Vascular: The pulmonary arteries are well opacified with contrast. There are definite areas of acute thromboembolic disease within the right lung, primarily within the lower lobe. No definite left lung involvement is demonstrated. The central pulmonary arteries are patent. There is no significant atherosclerosis.  Mediastinum: Right IJ Port-A-Cath extends into the upper right atrium. No enlarged mediastinal, hilar or axillary lymph nodes are seen. The heart size is normal.  Lungs/Pleura: As demonstrated radiographically, there is a new large left pleural effusion with resulting subtotal collapse of the left lung. No pleural nodularity is apparent. There is no evidence of endobronchial lesion or nodule within the aerated portion of the left upper lobe. There is a small right pleural effusion. There are new irregular ground-glass densities within the right upper, middle and lower lobes.No endobronchial lesion or significant pleural effusion is identified.  Upper abdomen: Since the prior abdominal CT, there is  increasing ascites within the upper abdomen. Extensive metastatic disease to the liver is again demonstrated, primarily within the left lobe. There is severe hepatic steatosis. The adrenal glands are not imaged.  Musculoskeletal/Chest wall: Patient appears cachectic with generalized soft tissue edema. No focal soft tissue abnormality identified.No osseous metastases are seen.  Review of the MIP images confirms the above findings.  IMPRESSION: 1. Study is positive for acute pulmonary embolism within the right lung. There are no signs of right heart strain. 2. New large left pleural effusion with resulting subtotal collapse of the left lung. 3. Small right pleural effusion with irregular ground-glass densities centrally in the right lung, possibly related to prior radiation therapy. No typical signs of local recurrence or metastatic disease within the chest. 4. Extensive hepatic metastatic disease with increasing ascites. 5. Critical Value/emergent results were called by telephone at the time of interpretation on 04/21/2014 at 10:41 pm to Dr. Lacretia Leigh , who verbally acknowledged these results.   Electronically Signed   By: Camie Patience M.D.   On: 04/21/2014 22:41   Dg Chest Port 1 View  04/21/2014   CLINICAL DATA:  Metastatic colonic malignancy; generalized pain and fatigue  EXAM: PORTABLE CHEST - 1 VIEW  COMPARISON:  CT scan of the chest of April 15, 2013  FINDINGS: There is a moderate-sized left pleural effusion which has appeared since the previous study. Increased density in the left perihilar region is present. The left heart border is obscured. On the right the interstitial markings are mildly increased. The power port appliance tip lies in the distal SVC.  IMPRESSION: There is a new left pleural effusion. A left hilar mass and/or left lower lobe atelectasis is present.   Electronically Signed   By: David  Martinique   On: 04/21/2014 16:47    Scheduled Meds: . antiseptic oral rinse  15 mL Mouth Rinse BID   . docusate sodium  100 mg Oral BID  . enoxaparin (LOVENOX) injection  1 mg/kg Subcutaneous Q12H  . feeding supplement (ENSURE COMPLETE)  237 mL Oral BID BM  . ferrous sulfate  325 mg Oral BID  . pantoprazole  40 mg Oral Daily  . senna  1-2 tablet Oral Daily  . sodium chloride  250 mL Intravenous Once   Continuous Infusions: . dextrose 5 % and 0.45 % NaCl with KCl 20 mEq/L       Time spent:> 35 minutes    Velvet Bathe  [facility] Pager [phone]. If 7PM-7AM, please contact night-coverage at [email or web address], password [facility] 04/23/2014, 2:45 PM  LOS: 2 days

## 2014-04-23 NOTE — Telephone Encounter (Signed)
Abigail Butts, [place] left message re: update on pt.  Pt fell twice 04/21/14 and agreed to go hospital and possible [facility] after discharge.  Abigail Butts, RN wanted to make sure Dr. Benay Spice aware.  Note to Dr. Benay Spice.

## 2014-04-23 NOTE — Clinical Documentation Improvement (Signed)
Possible Clinical Conditions?  Severe Malnutrition   Protein Calorie Malnutrition Severe Protein Calorie Malnutrition    Other Condition Cannot clinically determine  Supporting Information:INITIAL NUTRITION ASSESSMENT   04/22/2014 11:14 AM  Severe malnutrition in the context of chronic illness     Thank You, Philippa Chester ,RN Clinical Documentation Specialist:  [place] Information Management

## 2014-04-24 MED ORDER — ENOXAPARIN SODIUM 80 MG/0.8ML ~~LOC~~ SOLN: 1.0000 mg/kg | Freq: Two times a day (BID) | SUBCUTANEOUS | Status: AC

## 2014-04-24 MED ORDER — OXYCODONE-ACETAMINOPHEN 10-325 MG PO TABS: 1.0000 | ORAL_TABLET | ORAL | Status: AC | PRN

## 2014-04-24 NOTE — Progress Notes (Signed)
RM [place] MSW Note:  Pt was intermittently confused and uncomfortable. Attempted to explain BP cost and had him sign BP financial. [facility] is ready to receive him this morning once discharged. SW will contact pt's sister Arbie Cookey to update on pt's disposition. Benedict Needy Trues[place],LCSW

## 2014-04-24 NOTE — Progress Notes (Signed)
Patient discharged to hospice residential home. All personal belongings sent with patient. Report called to Okey Dupre, RN at [facility].

## 2014-04-24 NOTE — Discharge Summary (Signed)
Physician Discharge Summary  Tony Barajas BMW:[id number] DOB: [birth year] DOA: 04/21/2014  PCP: Thurman Coyer, MD  Admit date: 04/21/2014 Discharge date: 04/24/2014  Time spent: > 35 minutes  Discharge Diagnoses:  Please see list below  Discharge Condition: stable  Diet recommendation: regular diet  Filed Weights   04/21/14 2246  Weight: 64.683 kg (142 lb 9.6 oz)    History of present illness:  58 yr old man with history of colon cancer. He is under the care of hospice at home. He states that he has been short of breath for the past three weeks and that it has become progressively worse. Patient evaluated and found to have new PE as well as left pleural effusion.   Hospital Course:  Active Problems:  Shortness of breath  - supplemental oxygen at residential hospice - Due to patient's continued deterioration after evaluation by hospice nurse and after discussion with oncologist thoracentesis canceled and plans were to transition to residential hospice for continued care  Hypotension  - resolved with IVF rehydration  Dyspnea  - Most likely secondary to pleural effusion and new diagnosis of pulmonary embolus.   Pulmonary embolus : principle problem  - continue anticoagulation with lovenox   severe MALNUTRITION  - RD on board  - Ensure complete BID while patient was in house   Procedures:  None  Consultations:  Hospice  Oncology  Discharge Exam: Filed Vitals:   04/24/14 0545  BP: 120/64  Pulse: 123  Temp: 97.3 F (36.3 C)  Resp: 20    General: pt in nad, alert and awake Cardiovascular: rrr, no mrg Respiratory: absent breath sounds at left lung base, no wheezes, [place] in place  Discharge Instructions You were cared for by a hospitalist during your hospital stay. If you have any questions about your discharge medications or the care you received while you were in the hospital after you are discharged, you can call the unit and asked to speak with the  hospitalist on call if the hospitalist that took care of you is not available. Once you are discharged, your primary care physician will handle any further medical issues. Please note that NO REFILLS for any discharge medications will be authorized once you are discharged, as it is imperative that you return to your primary care physician (or establish a relationship with a primary care physician if you do not have one) for your aftercare needs so that they can reassess your need for medications and monitor your lab values.  Discharge Instructions   Call MD for:  severe uncontrolled pain    Complete by:  As directed      Call MD for:  temperature >100.4    Complete by:  As directed      Diet - low sodium heart healthy    Complete by:  As directed      Increase activity slowly    Complete by:  As directed             Medication List    STOP taking these medications       zolpidem 5 MG tablet  Commonly known as:  AMBIEN      TAKE these medications       docusate sodium 100 MG capsule  Commonly known as:  COLACE  Take 100 mg by mouth 2 (two) times daily.     enoxaparin 80 MG/0.8ML injection  Commonly known as:  LOVENOX  Inject 0.65 mLs (65 mg total) into the skin every 12 (twelve)  hours.     ferrous sulfate 325 (65 FE) MG tablet  Take 325 mg by mouth 2 (two) times daily.     lidocaine-prilocaine cream  Commonly known as:  EMLA  Apply topically as needed. Apply 1 teaspoon to PAC site 1-2 hours prior to stick and cover with plastic wrap. DO NOT RUB CREAM IN.     oxyCODONE-acetaminophen 10-325 MG per tablet  Commonly known as:  PERCOCET  Take 1 tablet by mouth every 4 (four) hours as needed for pain.     pantoprazole 40 MG tablet  Commonly known as:  PROTONIX  Take 1 tablet (40 mg total) by mouth daily.     potassium chloride SA 20 MEQ tablet  Commonly known as:  K-DUR,KLOR-CON  Take 1 tablet (20 mEq total) by mouth daily.     senna 8.6 MG tablet  Commonly known as:   SENOKOT  Take 1-2 tablets by mouth daily.       Allergies  Allergen Reactions  . Codeine Itching and Nausea Only      The results of significant diagnostics from this hospitalization (including imaging, microbiology, ancillary and laboratory) are listed below for reference.    Significant Diagnostic Studies: Ct Angio Chest Pe W/cm &/or Wo Cm  04/21/2014   CLINICAL DATA:  Shortness breath with cough for 1 day. History of metastatic cancer in hospice care.  EXAM: CT ANGIOGRAPHY CHEST WITH CONTRAST  TECHNIQUE: Multidetector CT imaging of the chest was performed using the standard protocol during bolus administration of intravenous contrast. Multiplanar CT image reconstructions and MIPs were obtained to evaluate the vascular anatomy.  CONTRAST:  137mL OMNIPAQUE IOHEXOL 350 MG/ML SOLN  COMPARISON:  Radiographs today. Chest CT 04/15/2013. Abdominal CT 12/18/2013.  FINDINGS: Vascular: The pulmonary arteries are well opacified with contrast. There are definite areas of acute thromboembolic disease within the right lung, primarily within the lower lobe. No definite left lung involvement is demonstrated. The central pulmonary arteries are patent. There is no significant atherosclerosis.  Mediastinum: Right IJ Port-A-Cath extends into the upper right atrium. No enlarged mediastinal, hilar or axillary lymph nodes are seen. The heart size is normal.  Lungs/Pleura: As demonstrated radiographically, there is a new large left pleural effusion with resulting subtotal collapse of the left lung. No pleural nodularity is apparent. There is no evidence of endobronchial lesion or nodule within the aerated portion of the left upper lobe. There is a small right pleural effusion. There are new irregular ground-glass densities within the right upper, middle and lower lobes.No endobronchial lesion or significant pleural effusion is identified.  Upper abdomen: Since the prior abdominal CT, there is increasing ascites within the  upper abdomen. Extensive metastatic disease to the liver is again demonstrated, primarily within the left lobe. There is severe hepatic steatosis. The adrenal glands are not imaged.  Musculoskeletal/Chest wall: Patient appears cachectic with generalized soft tissue edema. No focal soft tissue abnormality identified.No osseous metastases are seen.  Review of the MIP images confirms the above findings.  IMPRESSION: 1. Study is positive for acute pulmonary embolism within the right lung. There are no signs of right heart strain. 2. New large left pleural effusion with resulting subtotal collapse of the left lung. 3. Small right pleural effusion with irregular ground-glass densities centrally in the right lung, possibly related to prior radiation therapy. No typical signs of local recurrence or metastatic disease within the chest. 4. Extensive hepatic metastatic disease with increasing ascites. 5. Critical Value/emergent results were called by telephone at  the time of interpretation on 04/21/2014 at 10:41 pm to Dr. Lacretia Leigh , who verbally acknowledged these results.   Electronically Signed   By: Camie Patience M.D.   On: 04/21/2014 22:41   Dg Chest Port 1 View  04/21/2014   CLINICAL DATA:  Metastatic colonic malignancy; generalized pain and fatigue  EXAM: PORTABLE CHEST - 1 VIEW  COMPARISON:  CT scan of the chest of April 15, 2013  FINDINGS: There is a moderate-sized left pleural effusion which has appeared since the previous study. Increased density in the left perihilar region is present. The left heart border is obscured. On the right the interstitial markings are mildly increased. The power port appliance tip lies in the distal SVC.  IMPRESSION: There is a new left pleural effusion. A left hilar mass and/or left lower lobe atelectasis is present.   Electronically Signed   By: David  Martinique   On: 04/21/2014 16:47    Microbiology: Recent Results (from the past 240 hour(s))  CULTURE, BLOOD (ROUTINE X 2)      Status: None   Collection Time    04/21/14  4:03 PM      Result Value Ref Range Status   Specimen Description BLOOD RIGHT PORTA CATH   Final   Special Requests BOTTLES DRAWN AEROBIC AND ANAEROBIC 3ML   Final   Culture  Setup Time     Final   Value: 04/21/2014 19:38     Performed at [facility]   Culture     Final   Value:        BLOOD CULTURE RECEIVED NO GROWTH TO DATE CULTURE WILL BE HELD FOR 5 DAYS BEFORE ISSUING A FINAL NEGATIVE REPORT     Performed at [facility]   Report Status PENDING   Incomplete  CULTURE, BLOOD (ROUTINE X 2)     Status: None   Collection Time    04/21/14  4:09 PM      Result Value Ref Range Status   Specimen Description BLOOD LEFT ANTECUBITAL   Final   Special Requests BOTTLES DRAWN AEROBIC ONLY 3ML   Final   Culture  Setup Time     Final   Value: 04/21/2014 19:38     Performed at [facility]   Culture     Final   Value:        BLOOD CULTURE RECEIVED NO GROWTH TO DATE CULTURE WILL BE HELD FOR 5 DAYS BEFORE ISSUING A FINAL NEGATIVE REPORT     Performed at [facility]   Report Status PENDING   Incomplete  URINE CULTURE     Status: None   Collection Time    04/21/14  5:49 PM      Result Value Ref Range Status   Specimen Description URINE, CLEAN CATCH   Final   Special Requests NONE   Final   Culture  Setup Time     Final   Value: 04/22/2014 01:11     Performed at [facility]     Final   Value: NO GROWTH     Performed at [facility]   Culture     Final   Value: NO GROWTH     Performed at [facility]   Report Status 04/22/2014 FINAL   Final     Labs: Basic Metabolic Panel:  Recent Labs Lab 04/21/14 1603 04/22/14 0626  NA 135* 138  K 3.6* 3.6*  CL 103 106  CO2 22 21  GLUCOSE 83 83  BUN 6 6  CREATININE 0.65 0.71  CALCIUM 7.4* 7.4*   Liver Function Tests:  Recent Labs Lab 04/21/14 1603  AST 32  ALT 12  ALKPHOS 173*  BILITOT 1.3*  PROT 5.7*  ALBUMIN 1.3*    No results found for this basename: LIPASE, AMYLASE,  in the last 168 hours No results found for this basename: AMMONIA,  in the last 168 hours CBC:  Recent Labs Lab 04/21/14 1603 04/22/14 0626  WBC 12.8* 13.6*  NEUTROABS 10.1*  --   HGB 11.3* 10.7*  HCT 33.0* 32.2*  MCV 90.9 92.8  PLT 373 344   Cardiac Enzymes: No results found for this basename: CKTOTAL, CKMB, CKMBINDEX, TROPONINI,  in the last 168 hours BNP: BNP (last 3 results)  Recent Labs  04/21/14 1603  PROBNP 417.1*   CBG: No results found for this basename: GLUCAP,  in the last 168 hours     Signed:  Velvet Bathe  [facility] 04/24/2014, 12:39 PM

## 2014-04-24 NOTE — Progress Notes (Signed)
Clinical Social Work Department BRIEF PSYCHOSOCIAL ASSESSMENT 04/24/2014  Patient:  Tony Barajas Barajas, Tony Barajas Barajas     Account Number:  [id number]     Admit date:  04/21/2014  Clinical Social Worker:  Earlie Server  Date/Time:  04/24/2014 10:00 AM  Referred by:  Physician  Date Referred:  04/24/2014 Referred for  Residential hospice placement   Other Referral:   Interview type:  Patient Other interview type:    PSYCHOSOCIAL DATA Living Status:  ALONE Admitted from facility:   Level of care:   Primary support name:  Arbie Cookey Primary support relationship to patient:  SIBLING Degree of support available:   Adequate    CURRENT CONCERNS Current Concerns  Post-Acute Placement   Other Concerns:    SOCIAL WORK ASSESSMENT / PLAN CSW received referral to assist with DC planning. CSW reviewed chart and has been working with hospice SW Benedict Needy) re: disposition.    Hospice SW reports patient has a bed available at [facility] today. Patient was discussed during progression meeting and MD reports that patient needs procedure prior to DC. CSW updated hospice SW on DC barriers and will continue to update hospice on plans. Signed DNR form on chart for when patient is medically stable to DC.    CSW met with patient at bedside. Patient is aware of plans and reports hospice SW has explained details and spoken with family. Patient reports he is ready to DC as soon as possible.    CSW will continue to follow.   Assessment/plan status:  Psychosocial Support/Ongoing Assessment of Needs Other assessment/ plan:   Information/referral to community resources:   Will DC to [facility]    PATIENT'S/FAMILY'S RESPONSE TO PLAN OF CARE: Patient alert and agreeable to assessment. Patient complaining of pain and reports he is ready to DC today. Patient states that he is accepting of hospice placement and just wants to be comfortable. Patient appears anxious and moving around in bed. Patient reports he does not need  to talk right now but needs his RN to check on his pain medications. CSW made RN aware of patient's needs.    Tony Barajas, [name] [phone]

## 2014-04-24 NOTE — Progress Notes (Signed)
Clinical Social Work  CSW faxed DC summary to [facility] and spoke with hospice RN Santiago Glad) and hospice SW Benedict Needy) who are aware of DC and agreeable to accept today. CSW prepared DC packet with DNR, DC summary, and hard scripts included. CSW informed sister Arbie Cookey) of DC who reports she will go and visit patient this afternoon and is happy that he is DC today.  PTAR #: [id number].  CSW is signing off but available if needed.  [place], [place] [phone]

## 2014-04-24 NOTE — Progress Notes (Addendum)
Inpatient RN visit- [place] [facility] RN Visit-Karen Alford [place] RN  Related noncovered admission to [facility] diagnosis of Colon Ca w/ mets  Pt is DNR code. Pt seen at bedside, lying in bed, able to answer simple questions, appears restless will not keep O2 in place, intermittent confusion noted. Per earlier conversation with SW Benson [place], pt was able to verbalize his desire to go to [facility] as soon as possible. Pt has a bed at [facility] ([facility])  and can transfer today, however there is an order for a thoracentesis to be done prior to discharge. Writer spoke with attending Dr. Wendee Beavers, who placed order at pt's oncologist, Dr. Gearldine Shown request. Discussed need for symptom management at [facility] as soon as possible, Dr. Wendee Beavers in agreement, but feels he cannot d/c thoracentisis order. Staff RN Meredith Mody unable to get consent from pt for procedure d/t his intermittent confusion and declining status. Staff RN Meredith Mody attempted to contact Dr. Benay Spice, who was unavailable.  On call oncologist has requested that oncology NP evaluate pt for necessity of thoracentesis prior to d/c to [facility]. Writer spoke with Sherrilyn Rist PA on call, she plans to contact Dr. Mckinley Jewel for discontinuation of thoracentesis order. After conferring with Dr.Sabi, she recommends Dr. Wendee Beavers talk with Dr. Mckinley Jewel as oncology is not consulting during this admission. Writer and staff [employer] spoke with Dr. Wendee Beavers, who agreed to d/c order for thoracentesis.  [facility] SW [place] made aware that pt can transfer to [facility]. Staff CSW [place] also made aware and will arrange transport.  Writer remained at bedside through out the morning due to patient's rapid decline. Changes noted with in the time of visit: pt able to sit at bed side at 10am, by 11 only roused slightly to touch, 12pm, feet cool, mottled, use of accessory muscles, discomfort with movement, pt very restless, attempting to get up, settled with use of urinal. Staff  RN Meredith Mody gave PRN lorazepam per orders. Pt more settled briefly de d/t increased reslafter medication given. Writer remained at bedside d/t increased restlessness until transport arrived for d/c to [facility].  [facility] will continue to monitor through discharge.    Patient's home medication list, transfer summary and OOF DNR in place on shadow chart.   Please call [facility] @ [phone]-with any hospice needs.   Thank you. Tracey Harries, RN  [facility]  Hospice Liaison  [phone])

## 2014-04-27 ENCOUNTER — Encounter: Payer: Self-pay | Admitting: *Deleted

## 2014-04-27 LAB — CULTURE, BLOOD (ROUTINE X 2)
CULTURE: NO GROWTH
CULTURE: NO GROWTH

## 2014-04-27 NOTE — Progress Notes (Signed)
Notification from Hospice that patient died on 2014-04-27 at 2099/01/02. MD notified. Appointment cancelled.

## 2014-04-30 ENCOUNTER — Ambulatory Visit: Payer: 59 | Admitting: Nurse Practitioner

## 2014-05-02 DEATH — deceased

## 2014-05-29 ENCOUNTER — Other Ambulatory Visit: Payer: Self-pay | Admitting: Pharmacist

## 2014-08-04 IMAGING — XA IR FLUORO GUIDE CV LINE*R*
1 series · 2 of 2 positions shown · non-contrast
Comparison: none

CLINICAL DATA: Colon cancer

[Series 300: line placements · 2 of 2 slices shown]
[im 1/2]
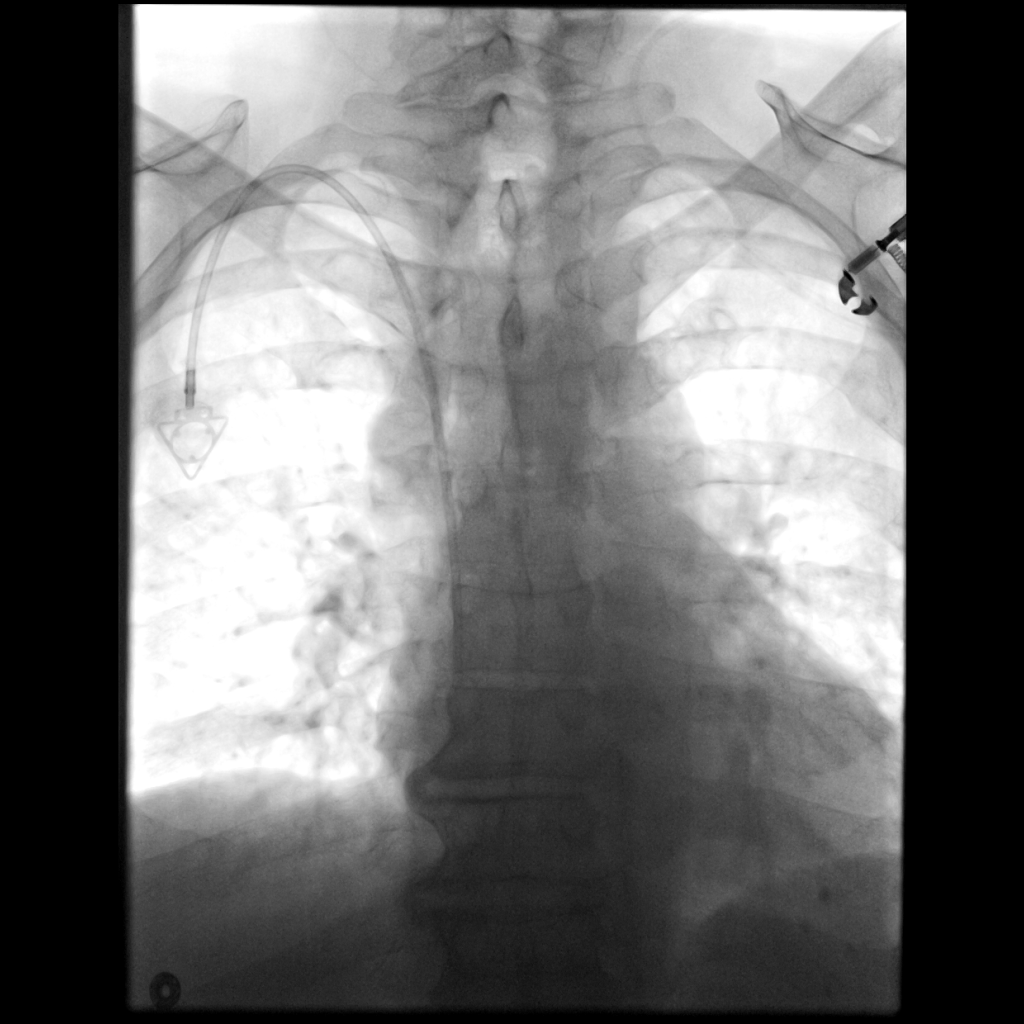
[im 2/2]
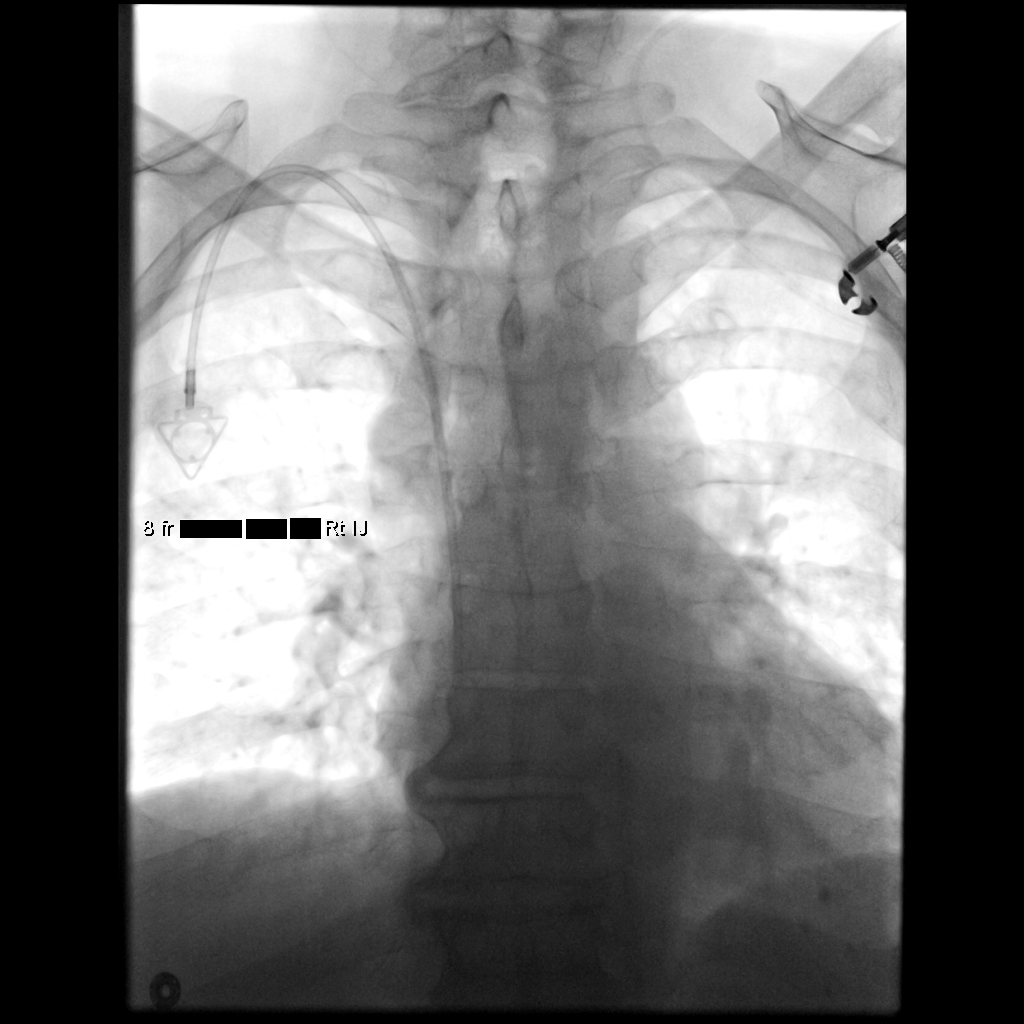

[2 of 2 positions shown; findings below may reference images not displayed]

ULTRASOUND GUIDANCE FOR VASCULAR ACCESS
RIGHT INTERNAL JUGULAR SINGLE LUMEN POWER PORT CATHETER INSERTION

Date: 04/28/2013 [DATE]

Radiologist:  Indratno Miejeelx, M.D.

Medications:  2 grams ancefadministered within 1 hour of the
procedure.4 mg Versed, 2 mcg Fentanyl

Guidance:  Ultrasound and fluoroscopic

Fluoroscopy time:  42 seconds

Sedation time:  35 minutes

Contrast volume:  None.

Complications:  No immediate

PROCEDURE/FINDINGS:

Informed consent was obtained from the patient following
explanation of the procedure, risks, benefits and alternatives.
The patient understands, agrees and consents for the procedure.
All questions were addressed.  A time out was performed.

Maximal barrier sterile technique utilized including caps, mask,
sterile gowns, sterile gloves, large sterile drape, hand hygiene,
and 2% chlorhexidine scrub.

Under sterile conditions and local anesthesia, right internal
jugular micropuncture venous access was performed.  Access was
performed with ultrasound.  Images were obtained for documentation.
A guide wire was inserted followed by a transitional dilator.  This
allowed insertion of a guide wire and catheter into the IVC.
Measurements were obtained from the SVC / RA junction back to the
right IJ venotomy site.  In the right infraclavicular chest, a
subcutaneous pocket was created over the second anterior rib.  This
was done under sterile conditions and local anesthesia.  1%
lidocaine with epinephrine was utilized for this.  A 2.5 cm
incision was made in the skin.  Blunt dissection was performed to
create a subcutaneous pocket over the right pectoralis major
muscle.  The pocket was flushed with saline vigorously.  There was
adequate hemostasis.  The port catheter was assembled and checked
for leakage.  The port catheter was secured in the pocket with two
retention sutures.  The tubing was tunneled subcutaneously to the
right venotomy site and inserted into the SVC/RA junction through a
valved peel-away sheath.  Position was confirmed with fluoroscopy.
Images were obtained for documentation.  The patient tolerated the
procedure well.  No immediate complications.  Incisions were closed
in a two layer fashion with 4 - 0 Vicryl suture.  Dermabond was
applied to the skin. The port catheter was accessed, blood was
aspirated followed by saline and heparin flushes.  Needle was
removed.  A dry sterile dressing was applied.
IMPRESSION: Ultrasound and fluoroscopically guided right internal jugular
single lumen power port catheter insertion.  Tip in the SVC/RA
junction.  Catheter ready for use.

## 2014-08-11 IMAGING — US US BIOPSY
1 series · 9 of 9 positions shown · non-contrast
Comparison: none

Clinical Data/Indication: Colon and liver lesions.

ULTRASOUND-GUIDED BIOPSY OF A LEFT LOBE LIVER LESION.  CORE.
Sedation: Versed 3.0 mg, Fentanyl 150 mcg.
Total Moderate Sedation Time: 10 minutes.
Procedure: The procedure, risks, benefits, and alternatives were
explained to the patient. Questions regarding the procedure were
encouraged and answered. The patient understands and consents to
the procedure.
The epigastrum was prepped with betadine in a sterile fashion, and
a sterile drape was applied covering the operative field. A sterile
gown and sterile gloves were used for the procedure.
Under sonographic guidance, the 17 gauge needle was inserted into a
lesion within the left lobe of the liver.  Three 18 gauge core
biopsies were obtained.  Gelfoam slurry was injected into the
tract. Final imaging was performed.
Patient tolerated the procedure well without complication.  Vital
sign monitoring by nursing staff during the procedure will continue
as patient is in the special procedures unit for post procedure
observation.

[Series 1: us biopsy · 0.32mm/px · 9 of 9 slices shown]
[im 1/9]
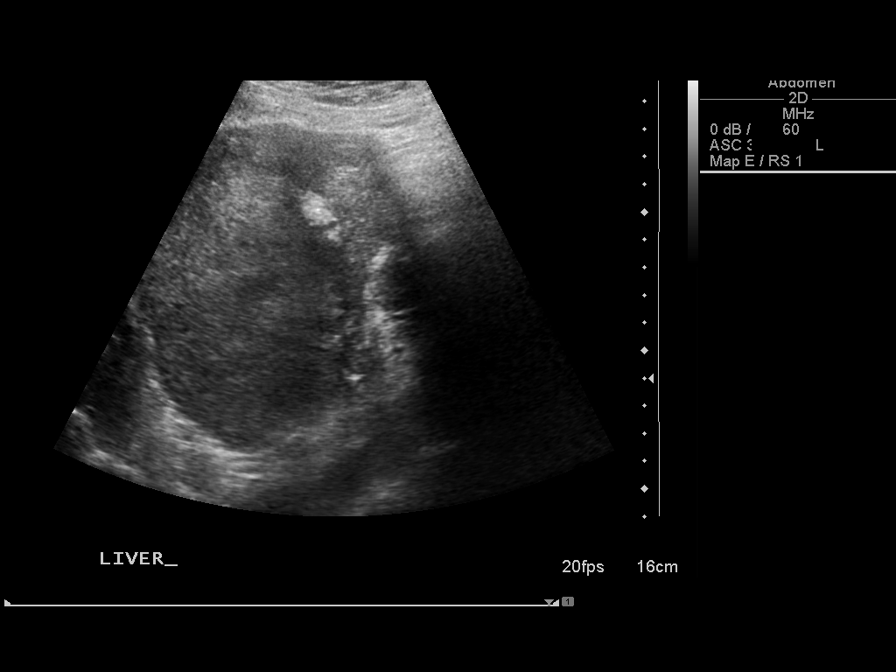
[im 2/9]
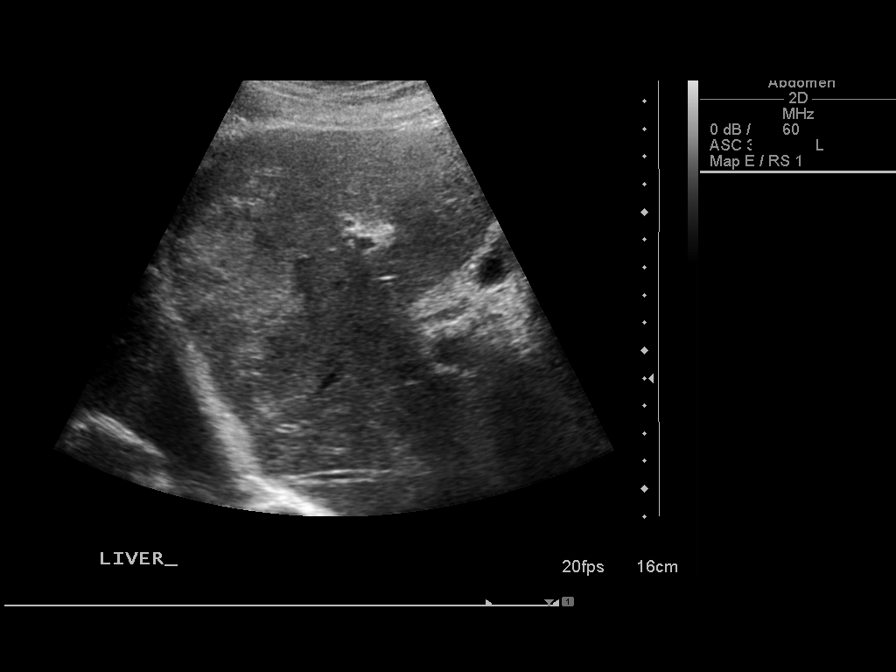
[im 3/9]
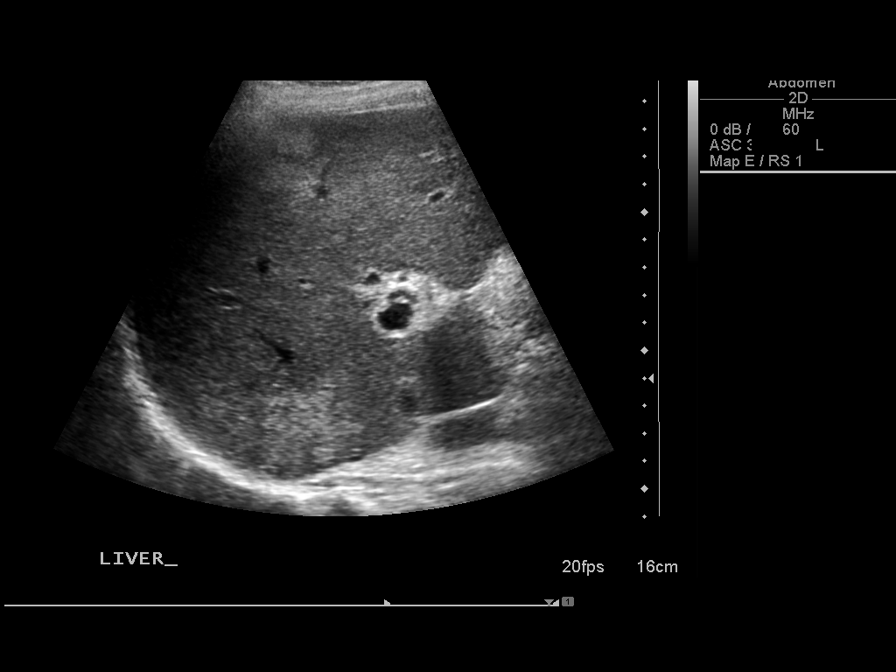
[im 4/9]
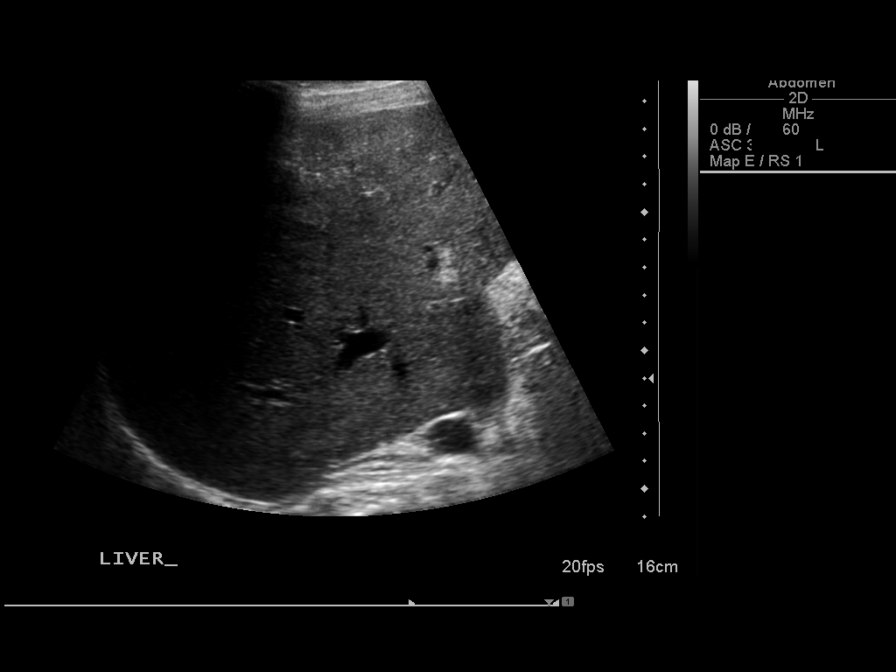
[im 5/9]
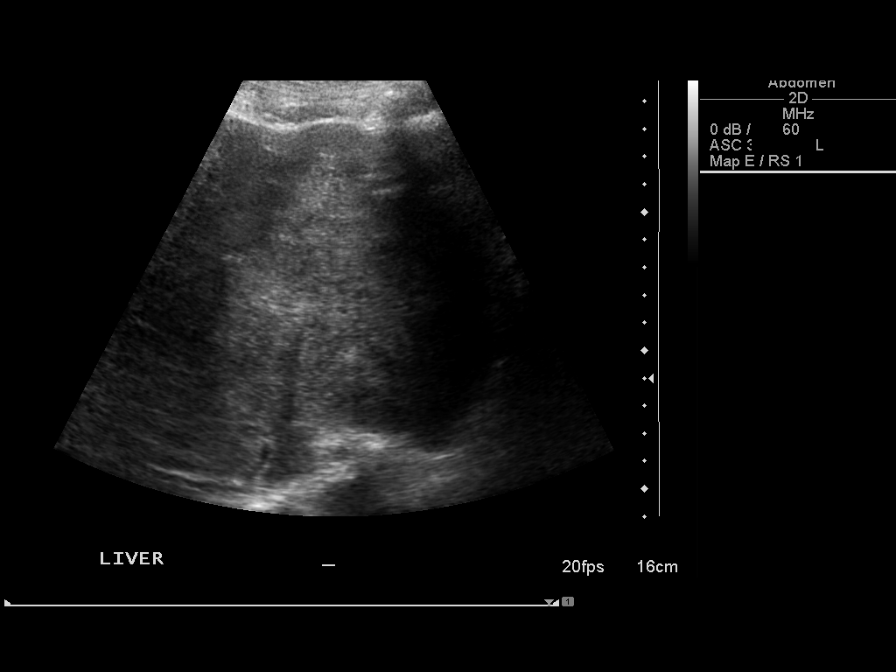
[im 6/9]
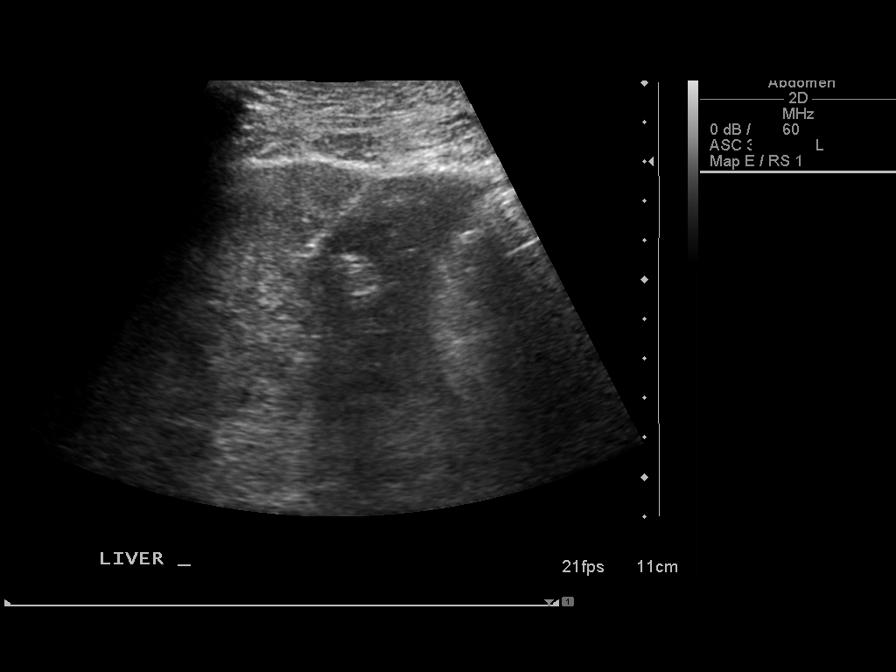
[im 7/9]
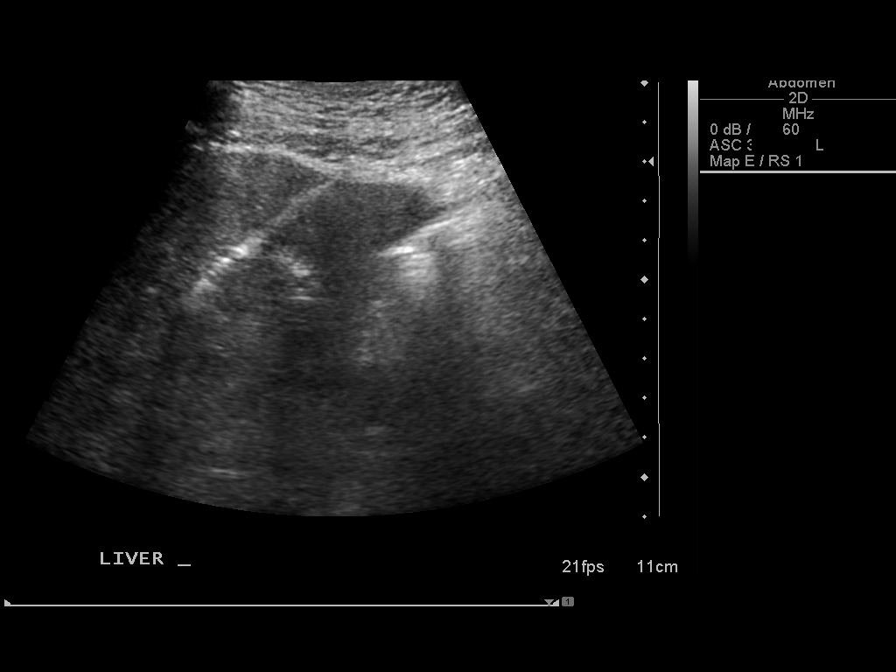
[im 8/9]
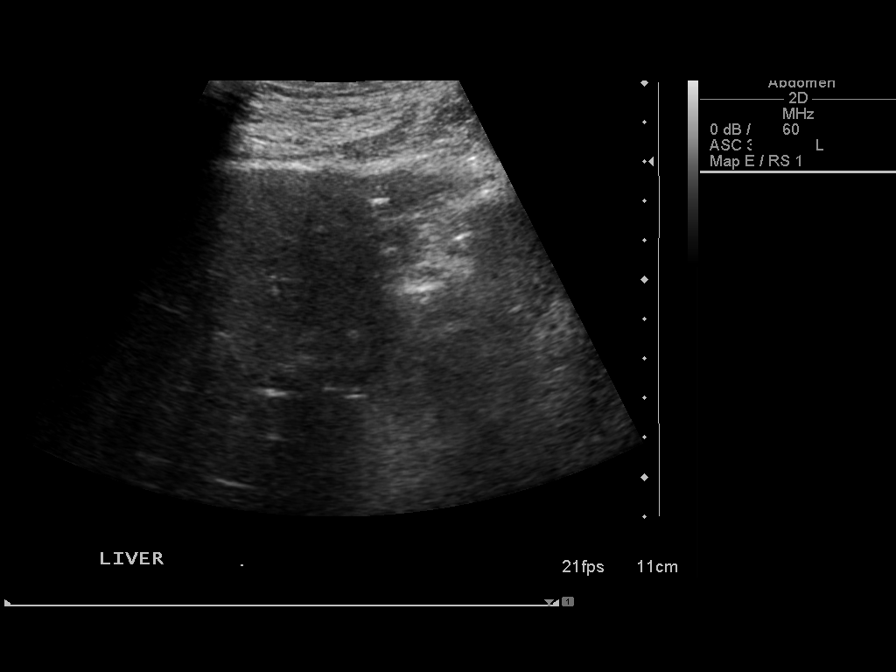
[im 9/9]
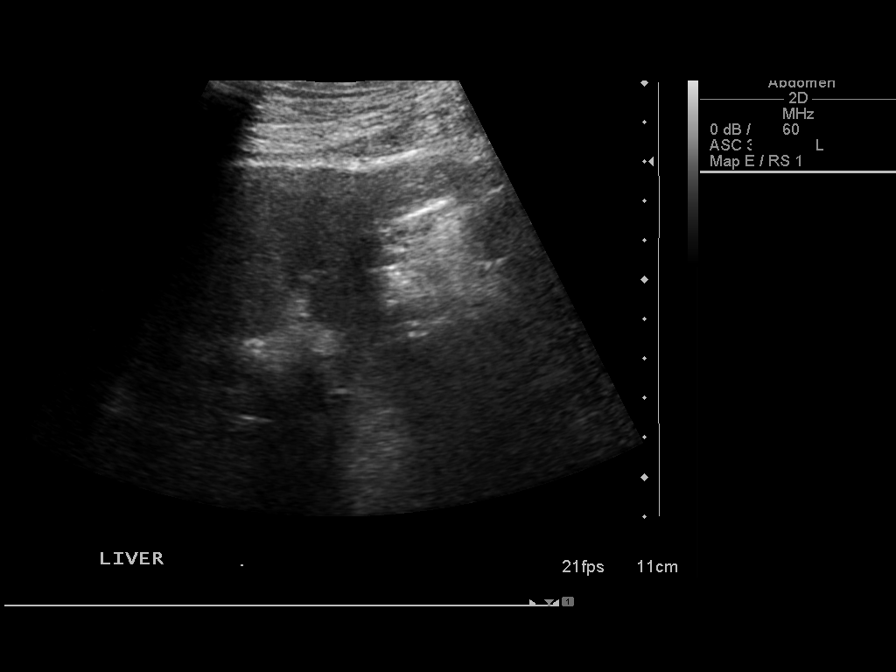

[9 of 9 positions shown; findings below may reference images not displayed]

FINDINGS: The images document guide needle placement within the
left lobe liver lesion. Post biopsy images demonstrate no
hemorrhage.
IMPRESSION: Successful ultrasound-guided core biopsy of a left lobe liver
lesion.

## 2015-03-26 IMAGING — CT CT ABD-PELV W/ CM
2 of 4 series · 16 of 46 positions shown, 18 images · IV contrast (OMNIPAQUE)
Comparison: 09/29/2013.

CLINICAL DATA: Colon cancer with liver metastasis.

EXAM:
CT ABDOMEN AND PELVIS WITH CONTRAST
TECHNIQUE: Multidetector CT imaging of the abdomen and pelvis was performed
using the standard protocol following bolus administration of
intravenous contrast.
CONTRAST:  100mL OMNIPAQUE IOHEXOL 300 MG/ML  SOLN

[Series 2: rtn a/p with · axial · 0.78mm/px · z∈[-524,-74]mm · 13 of 100 slices shown, 15 images]
[im 5/100  soft-tissue]
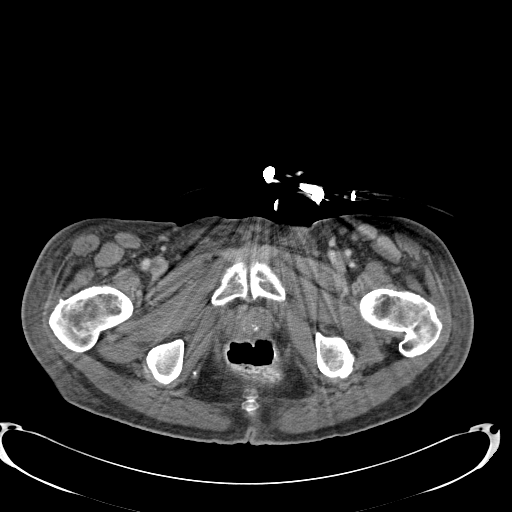
[im 5/100  bone]
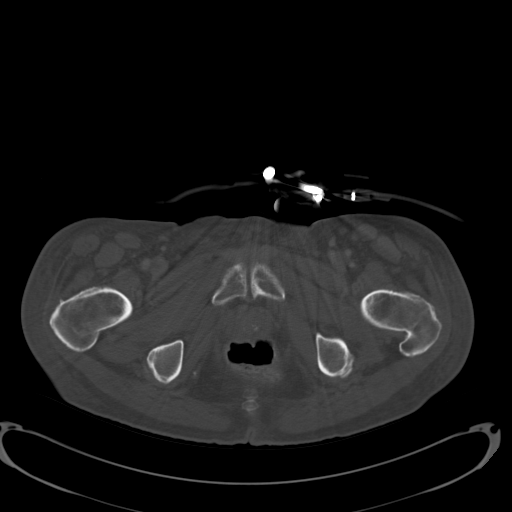
[im 13/100  soft-tissue]
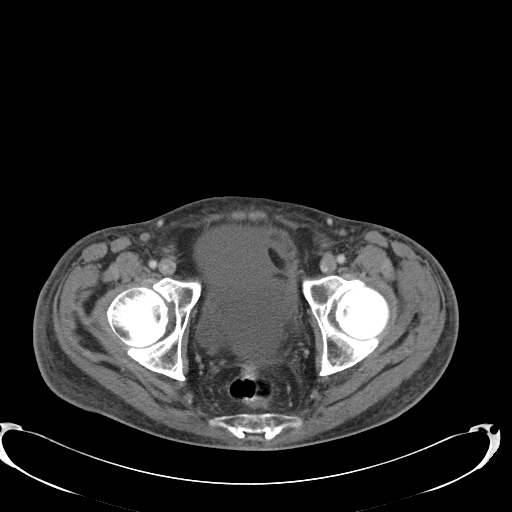
[im 22/100  soft-tissue]
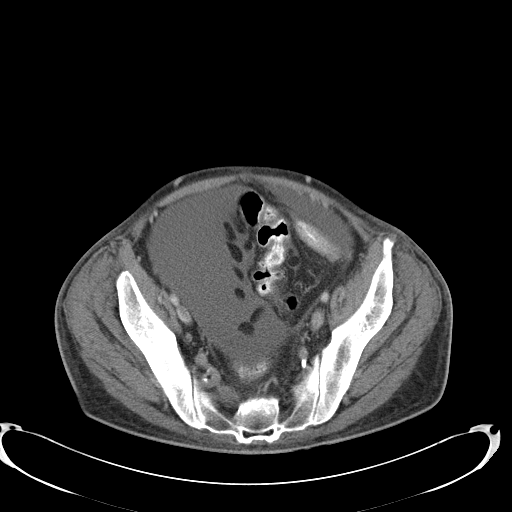
[im 26/100  soft-tissue]
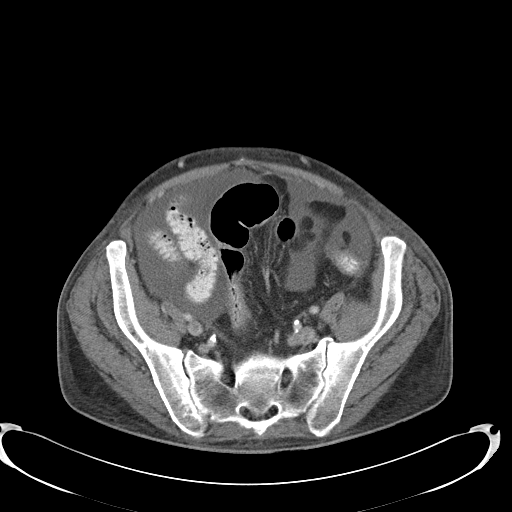
[im 35/100  soft-tissue]
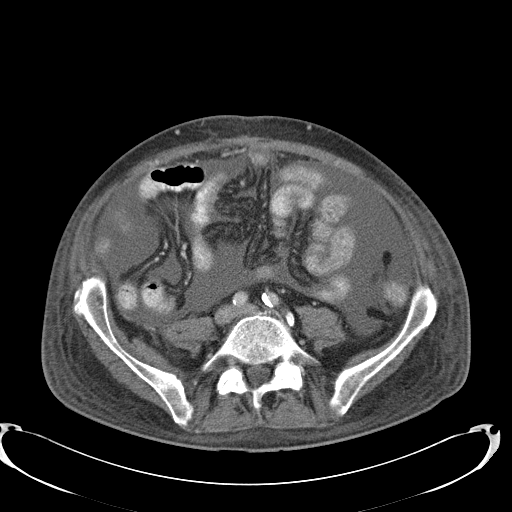
[im 44/100  soft-tissue]
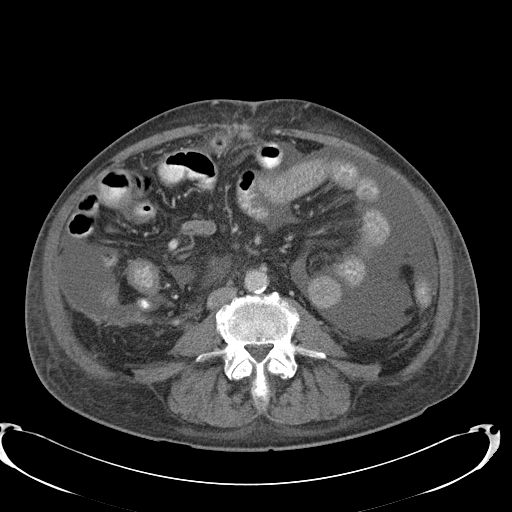
[im 52/100  soft-tissue]
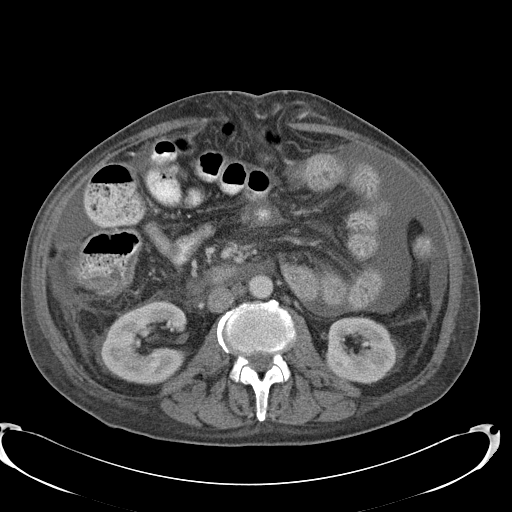
[im 56/100  soft-tissue]
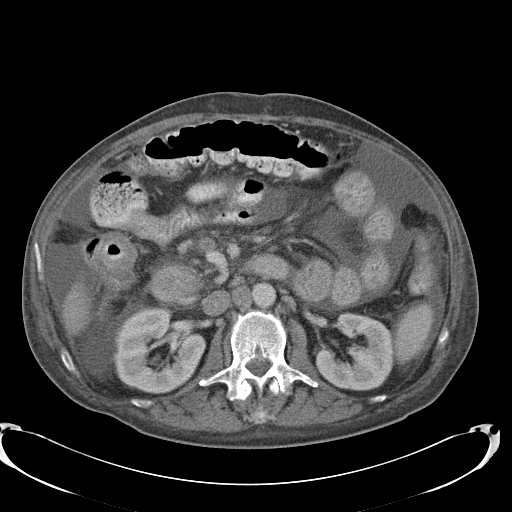
[im 65/100  soft-tissue]
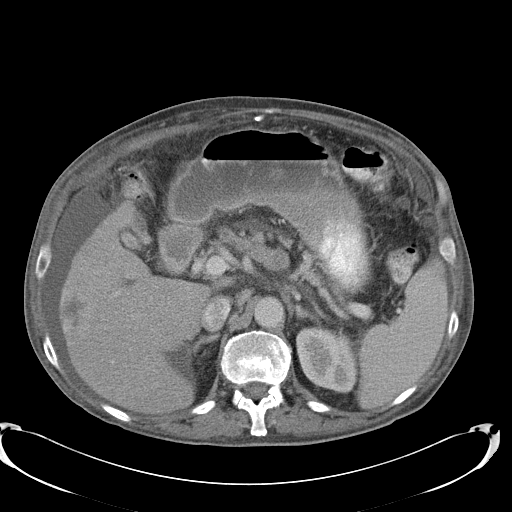
[im 65/100  bone]
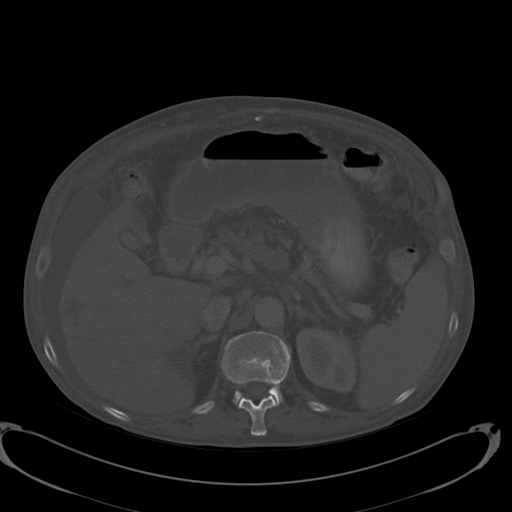
[im 74/100  soft-tissue]
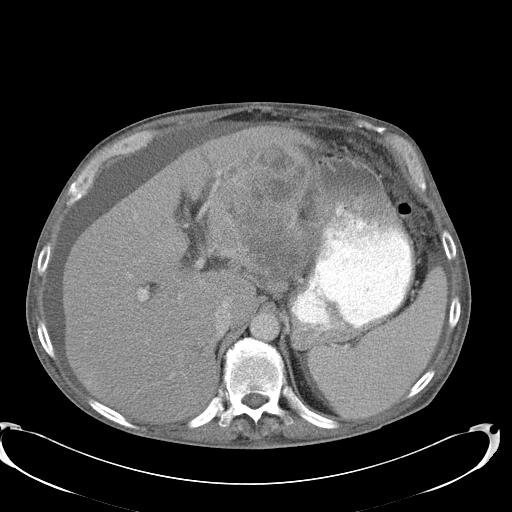
[im 78/100  soft-tissue]
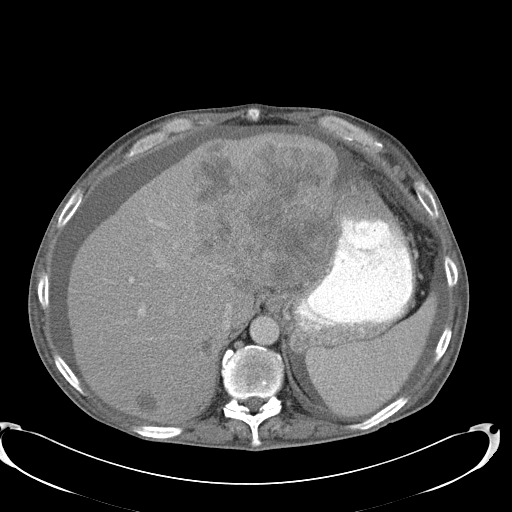
[im 87/100  soft-tissue]
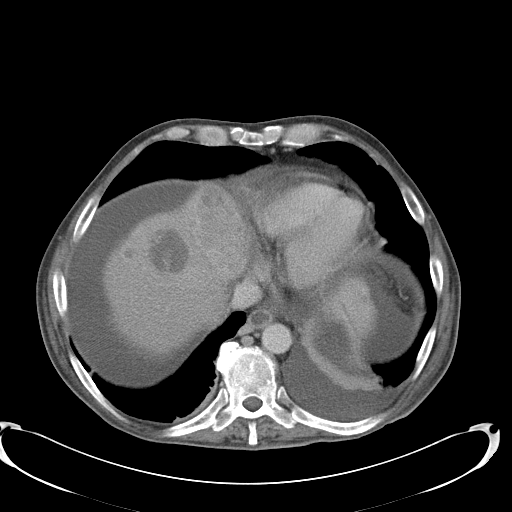
[im 95/100  soft-tissue]
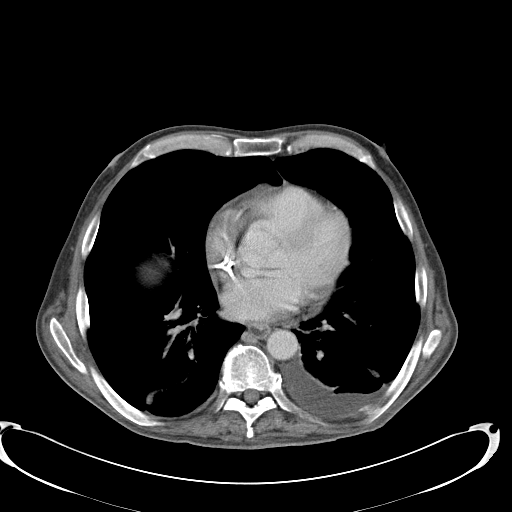

[Series 602: <mpr thick range> · coronal · 0.98mm/px · 3 of 100 slices shown]
[im 34/100  soft-tissue]
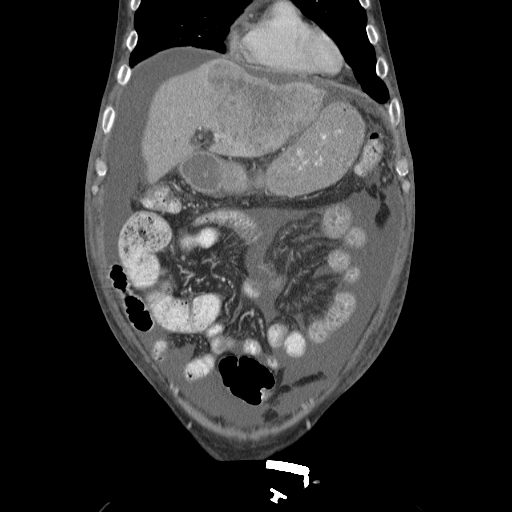
[im 45/100  soft-tissue]
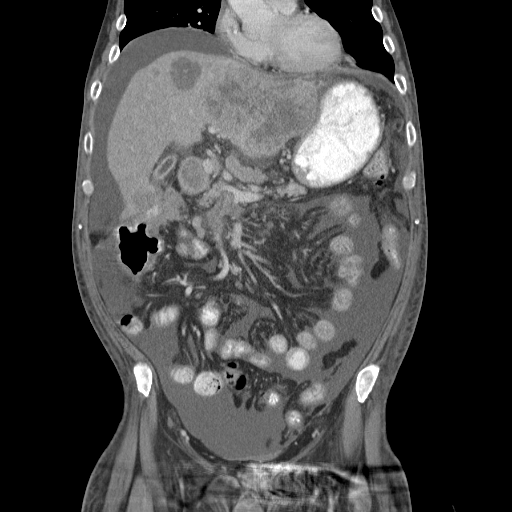
[im 56/100  soft-tissue]
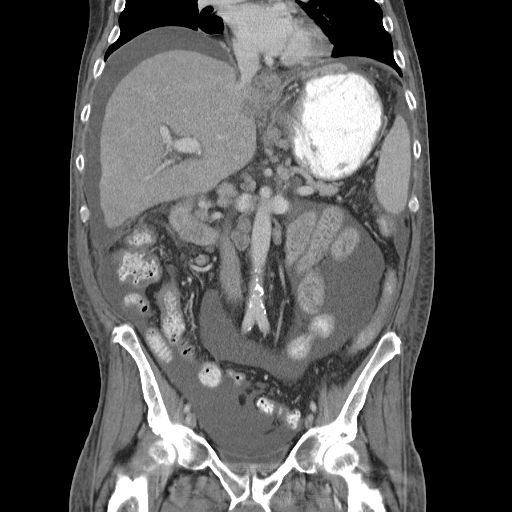

[16 of 46 positions shown; findings below may reference images not displayed]

FINDINGS: The lung bases demonstrate A small left pleural effusion with
overlying atelectasis. There is also mild right lower lobe
atelectasis. No worrisome pulmonary nodules. Small amount of fluid
is noted in the distal esophagus. No mass is identified. Coronary
artery calcifications are again noted.

The left hepatic lobe liver mass is relatively stable measuring 12 x
12 cm. The right hepatic lobe lesion on image number 34 is also
stable measuring a maximum of 3.2 cm. The right hepatic lobe lesion
near the dome of the liver on image number 14 is slightly smaller
(4.2 x 3.7 versus 3.8 x 3.2 cm) and appears somewhat necrotic when
compared to the prior study. No new lesions are identified.

The gallbladder is contracted. No common bowel duct dilatation. The
pancreas is unremarkable and stable. The spleen is normal in size.
No focal lesions. The adrenal glands and kidneys are unremarkable
and stable.

Progression of abdominal and pelvic ascites is noted. There is also
mesenteric edema and enlarging mesenteric and retroperitoneal lymph
nodes. Anterior aortocaval nodal mass on image number 46 measures
24.5 x 19.5 mm. It previously measured 22 x 14 mm. Adenopathy the in
the gastrohepatic ligament has increased. There is also significant
enlarging celiac axis lymphadenopathy and portal adenopathy. The
chain of celiac axis nodes on image number 35 measures 6.4 x 1.8 cm
and previously measured 4.9 x 1.7 cm.

The aorta and branch vessels are patent. There is persistent SMV
thrombosis.

The right-sided hepatic flexure colon cancer is progressive and
further invasion of the liver is noted.

The bladder, prostate gland and seminal vesicles are unremarkable.
There is a large amount of free pelvic fluid but no pelvic mass or
lymphadenopathy. No inguinal mass or adenopathy. Suspect prior deep
venous thrombosis of the common femoral veins which appear
recannulized.

No significant bony findings. Stable anterior abdominal wall hernia
containing fluid.
IMPRESSION: 1. Overall stable hepatic metastatic disease.  No new lesions.
2. Progressive abdominal lymphadenopathy.
3. Progressive hepatic flexure colon cancer and slightly progressive
direct liver invasion.
4. Stable SMV thrombosis.
5. Interval development of large volume abdominal/pelvic ascites.
6. Left pleural effusion and bibasilar atelectasis.
